# Patient Record
Sex: Female | Born: 1970 | Race: Black or African American | Hispanic: No | Marital: Married | Smoking: Never smoker
Health system: Southern US, Community
[De-identification: ages and names within clinical notes are randomized; demographics above are authoritative.]

## PROBLEM LIST (undated history)

## (undated) DIAGNOSIS — D259 Leiomyoma of uterus, unspecified: Secondary | ICD-10-CM

## (undated) HISTORY — PX: NEUROMA SURGERY: SHX722

## (undated) HISTORY — PX: UTERINE FIBROID SURGERY: SHX826

## (undated) HISTORY — PX: BACK SURGERY: SHX140

---

## 1998-12-23 ENCOUNTER — Other Ambulatory Visit: Admission: RE | Admit: 1998-12-23 | Discharge: 1998-12-23 | Payer: Self-pay | Admitting: Family Medicine

## 2000-12-07 ENCOUNTER — Other Ambulatory Visit: Admission: RE | Admit: 2000-12-07 | Discharge: 2000-12-07 | Payer: Self-pay | Admitting: Family Medicine

## 2001-11-16 ENCOUNTER — Encounter: Admission: RE | Admit: 2001-11-16 | Discharge: 2001-12-21 | Payer: Self-pay | Admitting: Internal Medicine

## 2002-02-16 ENCOUNTER — Encounter: Payer: Self-pay | Admitting: Orthopaedic Surgery

## 2002-02-16 ENCOUNTER — Inpatient Hospital Stay (HOSPITAL_COMMUNITY): Admission: EM | Admit: 2002-02-16 | Discharge: 2002-02-17 | Payer: Self-pay | Admitting: Orthopaedic Surgery

## 2002-12-31 ENCOUNTER — Other Ambulatory Visit: Admission: RE | Admit: 2002-12-31 | Discharge: 2002-12-31 | Payer: Self-pay | Admitting: *Deleted

## 2004-03-24 ENCOUNTER — Other Ambulatory Visit: Admission: RE | Admit: 2004-03-24 | Discharge: 2004-03-24 | Payer: Self-pay | Admitting: Family Medicine

## 2004-03-24 ENCOUNTER — Encounter: Admission: RE | Admit: 2004-03-24 | Discharge: 2004-03-24 | Payer: Self-pay | Admitting: Family Medicine

## 2006-05-11 ENCOUNTER — Other Ambulatory Visit: Admission: RE | Admit: 2006-05-11 | Discharge: 2006-05-11 | Payer: Self-pay | Admitting: Family Medicine

## 2006-05-16 ENCOUNTER — Encounter: Admission: RE | Admit: 2006-05-16 | Discharge: 2006-05-16 | Payer: Self-pay | Admitting: Family Medicine

## 2006-10-06 ENCOUNTER — Encounter (INDEPENDENT_AMBULATORY_CARE_PROVIDER_SITE_OTHER): Payer: Self-pay | Admitting: Specialist

## 2006-10-06 ENCOUNTER — Ambulatory Visit (HOSPITAL_COMMUNITY): Admission: RE | Admit: 2006-10-06 | Discharge: 2006-10-07 | Payer: Self-pay | Admitting: Obstetrics and Gynecology

## 2007-10-25 ENCOUNTER — Other Ambulatory Visit: Admission: RE | Admit: 2007-10-25 | Discharge: 2007-10-25 | Payer: Self-pay | Admitting: Family Medicine

## 2008-10-30 ENCOUNTER — Other Ambulatory Visit: Admission: RE | Admit: 2008-10-30 | Discharge: 2008-10-30 | Payer: Self-pay | Admitting: Family Medicine

## 2009-10-20 ENCOUNTER — Encounter: Admission: RE | Admit: 2009-10-20 | Discharge: 2009-10-20 | Payer: Self-pay | Admitting: Obstetrics and Gynecology

## 2010-11-12 ENCOUNTER — Other Ambulatory Visit: Payer: Self-pay | Admitting: Obstetrics and Gynecology

## 2010-11-12 DIAGNOSIS — Z1231 Encounter for screening mammogram for malignant neoplasm of breast: Secondary | ICD-10-CM

## 2010-12-09 ENCOUNTER — Ambulatory Visit: Payer: Self-pay

## 2010-12-21 ENCOUNTER — Inpatient Hospital Stay: Admission: RE | Admit: 2010-12-21 | Payer: Self-pay | Source: Ambulatory Visit

## 2010-12-31 NOTE — Op Note (Signed)
[name], [name]                 ACCOUNT NO.:  [id number]   MEDICAL RECORD NO.:  [id number]          PATIENT TYPE:  AMB   LOCATION:  DAY                          FACILITY:  [facility]   PHYSICIAN:  Crist Fat. Rivard, M.D. DATE OF BIRTH:  November 15, 1970   DATE OF PROCEDURE:  10/06/2006  DATE OF DISCHARGE:                               OPERATIVE REPORT   PREOPERATIVE DIAGNOSIS:  Uterine fibroids.   POSTOPERATIVE DIAGNOSIS:  Uterine fibroids.   ANESTHESIA:  General, Dr. __________ .   PROCEDURE:  Robotically assisted laparoscopy myomectomy.   SURGEON:  Crist Fat. Rivard, M.D.   ASSISTANTMarquis Lunch. Lowell Guitar, P.A.   DESCRIPTION OF PROCEDURE:  After being informed of the planned procedure  with possible complications including bleeding, infection, injury to  other organs, need for laparotomy, postoperative course, postoperative  dependent edema, hospital stay, full recovery, postoperative tubal  occlusion, and the possibility of growth of new fibroids; informed  consent is obtained.  The patient is taken to OR #10, given general  anesthesia with endotracheal intubation without complication.  She is  placed in the lithotomy position on a sticky mattress with sequential  compression device, knee high.  Her arms are padded and tucked.  Shoulder restraints are applied; and chest is restrained to the table.  She is then prepped and draped in a sterile fashion.  Pelvic exam  reveals an anteverted uterus with mainly posterior fibroids, two adnexa  that are normal.  Uterus is 12-14 weeks in size.  A weighted speculum is  inserted.  Anterior lip of the cervix is grasped with a tenaculum  forceps.  The cervix is easily dilated using Hegar dilator until #14  which allows [place] to sound the uterus at 7 cm.   A small #6 Rumi intrauterine manipulator is inserted easily; and the  balloon is inflated with 4 mL of saline.  The weighted speculum is  removed, but the tenaculum is kept on the anterior lip of the  cervix.  A  Foley catheter is then inserted in her bladder.  The patient is then  placed in steep Trendelenburg.   We measure 5 cm above the umbilicus on the midline and infiltrate this  area with 5 mL of Marcaine 0.25.  We perform a 10-mm vertical incision  and using an S-retractor we identify the fascia which is grasped and  incised.  Peritoneum is entered bluntly.  A #0 Vicryl pursestring suture  is placed on the fascia; and a 10-mm Hasson on trocar is inserted in the  intra abdominal cavity with the previously placed pursestring suture.  We can now create a pneumoperitoneum with C02 at a maximum pressure of  16 mmHg.  The camera is inserted.   OBSERVATION:  Anterior cul-de-sac is normal; posterior cul-de-sac is  normal.  The uterus has a small mid body anterior fibroid of 2 cm, a  right cornual fibroid of 4 cm., a posterior mid body fibroid of 5 cm,  and a posterior left lower uterine segment of fibroid of 5 cm.  Both  tubes and both ovaries are normal.  There are no pelvic adhesions.  The  appendix is seen and normal.  The liver edge is seen as normal;  gallbladder is seen as normal.  We then determine placement of all our  trocars; and all trocar sites are infiltrated with Marcaine 0.25 for a  total of 14 mL.  We placed two 8-mm robotic trocars on the left side,  one 8-mm robotic trocar on the right side; and one patient side  assistant 10-mm trocar on the right side, all under direct  visualization.  Placing a tenaculum grasper in arm #3, a PK forceps in  arm #2 and a hot scissors in arm #1 after we have docked the robot  easily.   Console time starts.  The uterus is moved anteriorly; and we  investigated the left lower uterine posterior fibroid, which  unfortunately is in continuity with the uterine artery.  It appears to  be subserosal; and the serosa is grasped and incised and cauterized with  PK forceps.  This allows [place] to enucleate most of the fibroid, but at its   junction with the uterine artery, decision is made not to pursue for  risk of damage to the uterine artery as well as impossibility to fully  visualize the ureter away from that side.  We then proceed with excision  of the fibroid, leaving 10% of its volume still attached to the lower  uterine body.  The fibroid is deposited in the cul-de-sac; and  hemostasis is achieved with cauterization.  We then proceed with the  myomectomy of the posterior fibroid; and that area is infiltrated with  vasopressin 10 mL.  The serosa is opened with unipolar scissors until  the fibroid is identified.  The fibroid is grasped with tenaculum  forceps; and with traction, countertraction, blunt-and-sharp dissection  we are able to be enucleate it completely and deposit it in the  posterior cul-de-sac.  The myometrium of this incision is then closed in  multiple layers of figure-of-eight stitches of #0 Vicryl; and then with  a baseball suture of #0 Vicryl.  Hemostasis is adequate.   Our attention is then directed to the right cornual fibroid.  Its serosa  is infiltrated with vasopressin 10 mL; and is opened with monopolar hot  scissors.  This allows [place] to visualize the fibroid grasp it with the  tenaculum forceps and using traction, countertraction, blunt-and-sharp  dissection we are able to be enucleate it completely with no entry in  the endometrial cavity.  The fibroid is deposited in the posterior cul-  de-sac; and myometrium is closed in two layers of figure-of-eight  stitches of #0 Vicryl including the serosa.  Hemostasis is adequate.  Two subserosal less than 1 cm fibroid on the left cornu are easily  enucleated, cauterized, and removed via the patient's side port.  Finally the 2 cm midbody anterior fibroid is enucleated after opening  the serosa with hot scissors; and removed through the 10-mm right-sided trocar.  This myometrium is then closed with two figure-of-eight  stitches of #0 Vicryl.   Hemostasis is adequate.  We proceed with profuse  irrigation of the pelvis with warm saline.  Complete hemostasis with  bipolar cauterization on the left lower uterine posterior fibroid  resection.  Hemostasis is then deemed adequate.  The 10-mm right trocar  is removed to allow the morcellator insertion.  All three large fibroids  from the posterior cul-de-sac are completely morcellated and removed  from the body.  We, again, irrigate profusely with  warm saline, note a  satisfactory hemostasis; and Tisseel is then deposited on the three  suture lines of the uterus.  We are satisfied with hemostasis.   The robot is then undocked; and trocars are removed under direct  visualization after evacuation of the pneumoperitoneum.  The fascia of  the supraumbilical incision is closed with the previously placed  pursestring suture of #0 Vicryl after ensuring no bowel loop is  protruding.  The fascia of the right patient side assistant trocar is  visualized, grasped with a Kocher forceps, and closed with a figure-of-  eight stitch of #0 Vicryl.  All incision skin is closed with  subcuticular suture of 3-0 Monocryl or Vicryl and Steri-Strips.   The intrauterine Rumi manipulator is then removed as well as the  tenaculum.  Cervix is evaluated and hemostasis is adequate.  Instruments  and sponge count is complete x2.  Estimated blood loss is 150 mL.  The  procedures is well tolerated by the patient who is taken to the recovery  room in a well and stable condition.      Crist Fat Rivard, M.D.  Electronically Signed     SAR/MEDQ  D:  10/06/2006  T:  10/06/2006  Job:  [id number]

## 2010-12-31 NOTE — H&P (Signed)
[name], [name]                 ACCOUNT NO.:  [id number]   MEDICAL RECORD NO.:  [id number]         PATIENT TYPE:  LAMB   LOCATION:                                FACILITY:  [facility]   PHYSICIAN:  Crist Fat. Rivard, M.D. DATE OF BIRTH:  11-Mar-1971   DATE OF ADMISSION:  DATE OF DISCHARGE:                              HISTORY & PHYSICAL   REASON FOR ADMISSION:  Uterine fibroids, inpatient desiring myomectomy.   HISTORY OF PRESENT ILLNESS:  This is a 40 year old single African  American female gravida 0 who presents to me on June 21, 2006, upon  recommendation of Dr. Osborn Bowen to discuss the possibility of  myomectomy.  This young lady initially presented to our practice with  complaints of urinary frequency and urgency, but no complaints of  urinary incontinence.  She is concerned that her symptoms have worsened  since the size of her fibroids have increased.  She wanted to review the  benefits of myomectomy.  Currently, for her urinary symptoms, she is  using Detrol LA with no improvement, and she has also previously tried  Information systems manager with no improvement.   An ultrasound down at [facility] on May 16, 2006, revealed a  posterior fibroid measuring 3.4x3.5x4.1 cm.  Another posterior fibroid,  mid body, measuring 2.9x3.2x3.4 cm and a fundal fibroid measuring  2.2x2.3x2.2 cm.   Her menstrual cycles are otherwise monthly with normal flow and no  severe dysmenorrhea, although, she is currently using Ortho-Evra patch  for contraceptive benefits.   Her urinary symptoms include urgency, having to use the bathroom every  one and a half hours with urgency incontinence.  She also reports  occasional incomplete emptying and occasional nocturia.  She denies any  dysuria, hematuria or urinary stress incontinence.  Her bowel habits  show chronic constipation with no change since her fibroids have been  diagnosed, but the patient has been known for irritable bowel syndrome.   She  is seeking robotic myomectomy today.  Although, she has been  informed on three occasions that this procedure may not completely  resolve her urinary symptoms.  She is also aware of the risks of the  procedure.   REVIEW OF SYSTEMS:  CONSTITUTIONAL:  Negative.  HEENT:  Negative.  CARDIOVASCULAR:  Negative.  GENITOURINARY:  See HPI.  RESPIRATORY:  Negative.  GASTROINTESTINAL:  Chronic constipation.  NEUROLOGICAL:  Negative.   PAST MEDICAL HISTORY:  1. Back surgery in July 2003.  2. Irritable bowel syndrome with constipation.  3. Gravida 0.   CURRENT MEDICATIONS:  Ortho-Evra and Detrol LA.   ALLERGIES:  No known drug allergies.   SOCIAL HISTORY:  Single, nonsmoker, lives alone, works in Chief Financial Officer for  [employer].   FAMILY HISTORY:  Negative for feminine or colon cancer.   PHYSICAL EXAMINATION:  VITAL SIGNS:  Blood pressure 118/78.  HEENT:  Negative.  Thyroid normal.  HEART:  Normal.  CHEST:  Clear.  BACK:  No CVA tenderness.  ABDOMEN:  No tenderness, masses or hepatosplenomegaly.  EXTREMITIES:  Negative.  NEUROLOGICAL:  Within normal limits.  GYN:  Normal external  genitalia and vagina.  Cervix is normal.  Uterus  is increased in size, 10-12 weeks irregular, predominantly with  posterior fibroids.  Nontender and mobile.  No adnexal masses felt.   ASSESSMENT:  Uterine fibroids in patient seeking robotic myomectomy,  symptomatic with urinary frequency.   PLAN:  We have scheduled a robotic myomectomy on February 22 at 7:30 at  [facility].  The procedure has been thoroughly reviewed with  the patient as well as the possible risks and complications including  but not limited to bleeding, infection, injury to other organs, need for  laparotomy, risk of new fibroids arising in the future, risk of postop  tubal occlusion.  Hospital course and recovery have been discussed as  well.  The length of the procedure being longer than traditional  laparotomy has been  discussed, and the postoperative dependent edema has  also been reviewed.   Finally, the patient has been informed that removal of fibroids may not  alleviate all of her urinary symptoms, and she is agreeable to proceed.  She has been given MiraLax bowel preparation as well as preoperative  instructions.      Crist Fat Rivard, M.D.  Electronically Signed     SAR/MEDQ  D:  09/27/2006  T:  09/27/2006  Job:  [id number]

## 2010-12-31 NOTE — Op Note (Signed)
[facility]  Patient:    [name], [name] Visit Number: [id number] MRN: [id number]          Service Type: MED Location: [phone] Attending Physician:  Patricia Nettle Dictated by:   Patricia Nettle, M.D. Proc. Date: 02/16/02 Admit Date:  02/16/2002 Discharge Date: 02/17/2002                             Operative Report  PREOPERATIVE DIAGNOSES: 1. 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 2. Degenerative disk disease.  POSTOPERATIVE DIAGNOSES: 1. 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 2. Degenerative disk disease.  PROCEDURE:  Right L5-S1 microdiskectomy with lateral recessed decompression.  SURGEON:  Patricia Nettle, M.D.  ASSISTANT:  Colleen P. Mahar, P.A.  ANESTHESIA:  General endotracheal.  COMPLICATIONS:  None.  INDICATIONS FOR PROCEDURE:  The patient is a 40 year old left hand dominant female who I have been treating for a right S1 radiculopathy. She has had an epidural steroid injection without any relief. Because she has been taking increasing amounts of narcotics, muscle relaxers and unable to ambulate, she was admitted to the hospital. Her examination was consistent with an S1 radiculopathy. Her MRI scan shows a large herniation at L5-S1 with severe compression of the right S1 nerve root. In addition, she has degenerative changes at L3-4 and L4-5 with an annular rent at the L4-5 level and a broad based disk bulge with lateral recess decompression. All of the risks, benefits, and alternatives of surgery were discussed with the patient and she elected to proceed. She understands that she does have significant degenerative changes at the L3-4 and L4-5 level and may need additional surgery there as well as a possible fusion procedure in the future at the L5-S1 level.  DESCRIPTION OF PROCEDURE:  The patient was properly identified in the holding area and taken to the operating room. She underwent general  endotracheal anesthesia without difficulty. She was given prophylactic IV antibiotics. She was turned prone onto the Acromed four-poster positioning frame. All bony prominences were padded. Her face and eyes were protected. The back was prepped and draped in the usual sterile fashion. We used fluoroscopy to identify the L5-S1 level at the skin using a spinal needle. A 2 1/2 cm incision was made directly over the L5-S1 disk space. This was carried down the deep fascia. The patient had quite a bit of adipose tissue at this level. Using long retractors, we identified the fascia, incised it and elevated the paraspinal muscles on the right side to the L5-S1 facet joint. Care was taken to protect the facet joint capsule. We then placed a curette under the L5 lamina again brought the fluoroscopy and confirmed that we were at the correct location. At this point, the operating microscope was draped and brought onto the surgical field. Using a high speed bur, the inferior 1/3 of the L5 lamina was removed as well as the medial 1/3 of the facet joint. Using micro Kerrisons and curettes, the over hanging lip of the S1 superior facet was removed flush with the S1 pedicle. We immediately identified a large disk herniation that had partially extruded through the annulus. The S1 nerve root was compressed and displaced medially. We easily popped into the disk space with a Penfield and then removed disk material with a pituitary rongeur. At this point, the S1 nerve root began to move laterally into its more normal position. It became evident that  the nerve root was inflamed and reddened. By carefully retracting the root medially, we were able to complete the diskectomy. All loose fragments were removed. We irrigated the disk space using an angiocath and floated out any loose fragments, these were all removed. We confirmed that the nerve root was free from the takeoff on the common dural sac all the way out  the S1 foramen by passing a blunt probe. When we were satisfied with the decompression, 50 mcg of fentanyl were left in the epidural space. A fat graft was taken from the subcutaneous tissue and placed over the nerve root. We then closed the wound in layers, first with #1 Vicryl in the deep fascia followed by 2-0 Vicryl in the subcutaneous layer and then a running 4-0. Benzoin and Steri-Strips were placed. The patient was turned supine, extubated without difficulty and transferred to the recovery room in stable condition. Dictated by:   Patricia Nettle, M.D. Attending Physician:  Patricia Nettle. DD:  02/16/02 TD:  02/19/02 Job: [id number]

## 2010-12-31 NOTE — H&P (Signed)
[facility]  Patient:    [name], [name] Visit Number: [id number] MRN: [id number]          Service Type: MED Location: [phone] Attending Physician:  Patricia Nettle Dictated by:   Patricia Nettle, M.D. Admit Date:  02/16/2002 Discharge Date: 02/17/2002                           History and Physical  CHIEF COMPLAINT:  Back and right leg pain.  HISTORY OF PRESENT ILLNESS:  The patient is a 40 year old left-hand-dominant female who I first saw in my office on January 23, 2002.  At that time she had severe back and right lower extremity pain.  Her symptoms were consistent with a right S1 radiculopathy.  I sent her for an MRI scan which showed a large right posterolateral disk herniation at L5-S1 with compression of the S1 root and possibly the L5 root at this level.  At L4-5 there was a posterior annular tear with a broad-based disk protrusion and some lateral recess stenosis on the right.  At L3-4 there was disk desiccation with a left extra foraminal disk herniation that was in the vicinity of the left L3 root.  I discussed all the treatment options with the patient on her return visit and she elected to undergo a selective nerve root injection.  This has this done and unfortunately had very little relief of her symptoms.  Over the past week or so her symptoms have been progressively worsening to the point that she is not ambulatory.  She is unable to sleep.  She is taking progressively more and more narcotic medication, anti-inflammatories and muscle relaxers.  She has been using a TENS unit.  Because of some progressive weakness in the right lower extremity she was admitted to the hospital.  She has a history of an overactive bladder and takes Detrol for this.  She has also been constipated. No incontinence of bowel or bladder.  No fevers, chills, sweats or unexplained weight loss.  No dysuria.  PAST MEDICAL HISTORY:  None.  PAST SURGICAL HISTORY:   None.  MEDICATIONS: 1. Detrol hormone patch. 2. Zelnorm. 3. She has also been taking Vicodin. 4. Flexeril. 5. Diclofenac.  ALLERGIES:  None.  SOCIAL HISTORY:  The patient is single and works at [employer] full time.  Does not use tobacco or alcohol.  Her primary care physician is Dr. Parke Simmers.  PHYSICAL EXAMINATION:  VITAL SIGNS:  She is 5 feet 2-1/2 inches tall.  She weighs 160 pounds.  MUSCULOSKELETAL:  She has an antalgic gait to the right.  She is really unable to stand erect.  She has severe pain with both extension and forward flexion radiating into the right lower extremity. There is tenderness over the right sciatic notch.  Motor examination shows 4/5 strength in the right gastrosoleus.  Sensation is decreased in the S1 distribution.  Straight leg raising is positive on the right.  Her buttock pain with radiation into the posterior thigh and calf.  Reflexes are hypoactive in the lower extremities. Babinski, Clonus, Hoffman negative.  Trochanter nontender.  LABORATORY DATA:  Show white blood count 7.7, hemoglobin 13.8, platelets 201. PT 12.5, PTT 26.  Her BMET was normal.  Beta hCG less than 2.  Her UA is pending.  IMPRESSION:  Large right L5-S1 disc herniation with intractable pain and progressive weakness.  PLAN:  I spoke to the patient yesterday as well as this  morning on the phone. Because of her progressive neurologic deficits and increasing pain uncontrolled with narcotics I have admitted her to the hospital.  I had a discussion with her concerning all the treatment options which would include IV pain medication, IV steroids, additional epidural steroid injections, neuroleptic medications, and additional time.  I do think that surgery would be indicated.  I discussed this with the patient and her family in detail which would require a L5-S1 microdiskectomy with decompression of the S1 nerve root.  She understands that she does have a herniation at L4-5. This  may become symptomatic in the future.  In addition she very well may have a recurrent herniation at L5-S1 requiring effusion in the future.  We discussed all of the risks of surgery including bleeding, infection, nerve root injury, dural tear, CSF leak, recurrent herniation and need for additional surgery including spinal fusion.  We also discussed the medical risks of surgery including MI, stroke, PE, and other imponderables.  I gave the patient a booklet on lumbar microsurgery and reviewed it with her.  We are going to make arrangements to do surgery today.  I think she has a good understanding of the above issues and we are going to proceed as she feels appropriate. Dictated by:   Patricia Nettle, M.D. Attending Physician:  Patricia Nettle. DD:  02/16/02 TD:  02/19/02 Job: [id number]

## 2011-03-29 ENCOUNTER — Ambulatory Visit
Admission: RE | Admit: 2011-03-29 | Discharge: 2011-03-29 | Disposition: A | Discharge status: Home / Self Care | Payer: Self-pay | Source: Ambulatory Visit | Attending: Obstetrics and Gynecology | Admitting: Obstetrics and Gynecology

## 2011-10-25 ENCOUNTER — Ambulatory Visit: Payer: Self-pay | Admitting: Obstetrics and Gynecology

## 2011-11-08 ENCOUNTER — Ambulatory Visit (INDEPENDENT_AMBULATORY_CARE_PROVIDER_SITE_OTHER): Payer: 59 | Admitting: Obstetrics and Gynecology

## 2011-11-08 DIAGNOSIS — Z01419 Encounter for gynecological examination (general) (routine) without abnormal findings: Secondary | ICD-10-CM

## 2011-11-28 ENCOUNTER — Telehealth: Payer: Self-pay | Admitting: Obstetrics and Gynecology

## 2011-11-28 NOTE — Telephone Encounter (Signed)
Pt can't find Nuvaring to remove.  Told pt it is possible that it has come out on it's own.  Pt is due to put in another ring on 12-04-11. Pt has no idea when the ring fell out.  Will consult SR for instructions.  ld

## 2011-11-28 NOTE — Telephone Encounter (Signed)
Routed to laura

## 2011-11-29 NOTE — Telephone Encounter (Signed)
Give appointment before due to reinsert. Advise condoms.

## 2012-03-05 DIAGNOSIS — I781 Nevus, non-neoplastic: Secondary | ICD-10-CM | POA: Insufficient documentation

## 2012-03-07 ENCOUNTER — Other Ambulatory Visit: Payer: Self-pay | Admitting: Obstetrics and Gynecology

## 2012-03-07 DIAGNOSIS — Z1231 Encounter for screening mammogram for malignant neoplasm of breast: Secondary | ICD-10-CM

## 2012-04-03 ENCOUNTER — Ambulatory Visit: Payer: 59

## 2012-04-04 ENCOUNTER — Ambulatory Visit (HOSPITAL_BASED_OUTPATIENT_CLINIC_OR_DEPARTMENT_OTHER)
Admission: RE | Admit: 2012-04-04 | Discharge: 2012-04-04 | Disposition: A | Discharge status: Home / Self Care | Payer: 59 | Source: Ambulatory Visit | Attending: Obstetrics and Gynecology | Admitting: Obstetrics and Gynecology

## 2012-04-04 DIAGNOSIS — Z1231 Encounter for screening mammogram for malignant neoplasm of breast: Secondary | ICD-10-CM

## 2012-04-06 NOTE — Progress Notes (Signed)
Quick Note:  Please send "Dense breast" letter to patient and document in chart when letter is sent. ______ 

## 2012-04-09 ENCOUNTER — Encounter: Payer: Self-pay | Admitting: Obstetrics and Gynecology

## 2013-03-21 ENCOUNTER — Other Ambulatory Visit: Payer: Self-pay

## 2013-03-21 DIAGNOSIS — Z1231 Encounter for screening mammogram for malignant neoplasm of breast: Secondary | ICD-10-CM

## 2013-04-05 ENCOUNTER — Ambulatory Visit: Payer: 59

## 2013-04-19 ENCOUNTER — Ambulatory Visit
Admission: RE | Admit: 2013-04-19 | Discharge: 2013-04-19 | Disposition: A | Discharge status: Home / Self Care | Payer: BC Managed Care – PPO | Source: Ambulatory Visit

## 2013-04-19 DIAGNOSIS — Z1231 Encounter for screening mammogram for malignant neoplasm of breast: Secondary | ICD-10-CM

## 2014-03-20 ENCOUNTER — Other Ambulatory Visit (HOSPITAL_BASED_OUTPATIENT_CLINIC_OR_DEPARTMENT_OTHER): Payer: Self-pay | Admitting: Obstetrics and Gynecology

## 2014-03-20 DIAGNOSIS — Z1231 Encounter for screening mammogram for malignant neoplasm of breast: Secondary | ICD-10-CM

## 2014-04-24 ENCOUNTER — Ambulatory Visit: Payer: BC Managed Care – PPO

## 2014-04-30 ENCOUNTER — Ambulatory Visit (HOSPITAL_BASED_OUTPATIENT_CLINIC_OR_DEPARTMENT_OTHER)
Admission: RE | Admit: 2014-04-30 | Discharge: 2014-04-30 | Disposition: A | Discharge status: Home / Self Care | Payer: BC Managed Care – PPO | Source: Ambulatory Visit | Attending: Obstetrics and Gynecology | Admitting: Obstetrics and Gynecology

## 2014-04-30 DIAGNOSIS — Z1231 Encounter for screening mammogram for malignant neoplasm of breast: Secondary | ICD-10-CM

## 2014-08-15 HISTORY — PX: ANKLE FRACTURE SURGERY: SHX122

## 2014-08-15 HISTORY — PX: FRACTURE SURGERY: SHX138

## 2014-09-08 DIAGNOSIS — M25572 Pain in left ankle and joints of left foot: Secondary | ICD-10-CM | POA: Insufficient documentation

## 2014-09-08 DIAGNOSIS — T148XXA Other injury of unspecified body region, initial encounter: Secondary | ICD-10-CM | POA: Insufficient documentation

## 2014-09-29 DIAGNOSIS — Z9889 Other specified postprocedural states: Secondary | ICD-10-CM | POA: Insufficient documentation

## 2014-09-29 DIAGNOSIS — E663 Overweight: Secondary | ICD-10-CM | POA: Insufficient documentation

## 2015-01-25 DIAGNOSIS — Z969 Presence of functional implant, unspecified: Secondary | ICD-10-CM | POA: Insufficient documentation

## 2015-05-23 IMAGING — MG MM DIGITAL SCREENING BILAT W/ CAD
5 series · 5 of 5 positions shown · non-contrast
Comparison: Previous exam(s).

CLINICAL DATA: Screening.

EXAM:
DIGITAL SCREENING BILATERAL MAMMOGRAM WITH CAD

[R CC (1 of 2)]
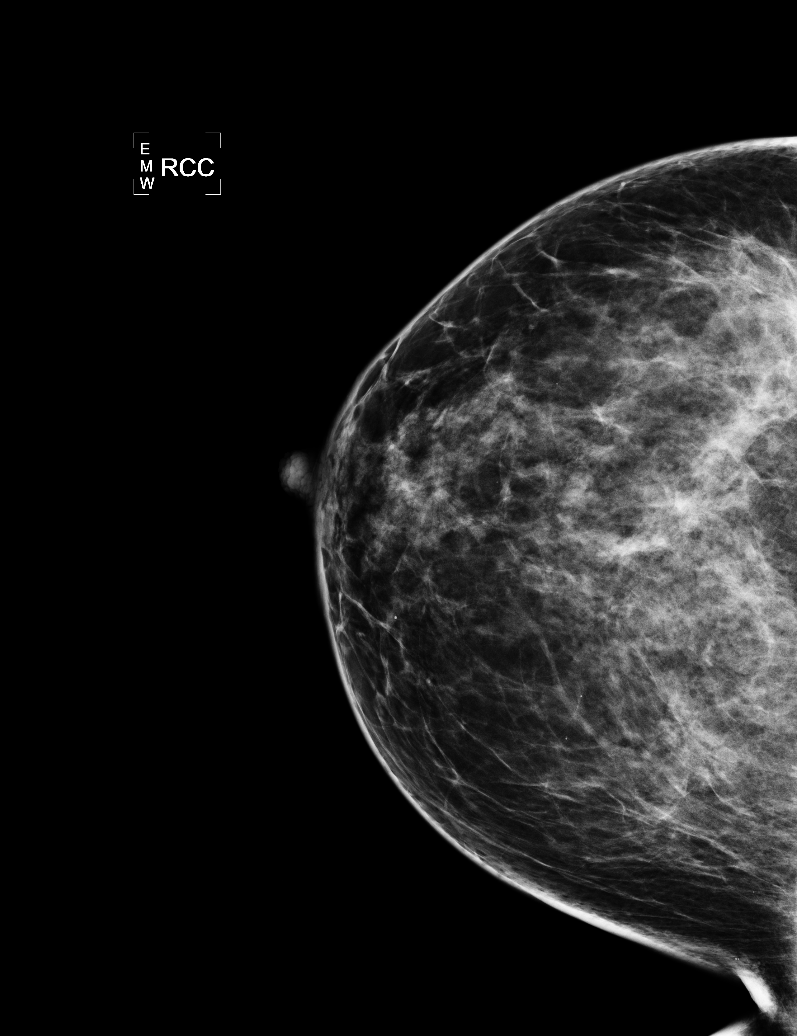

[L CC]
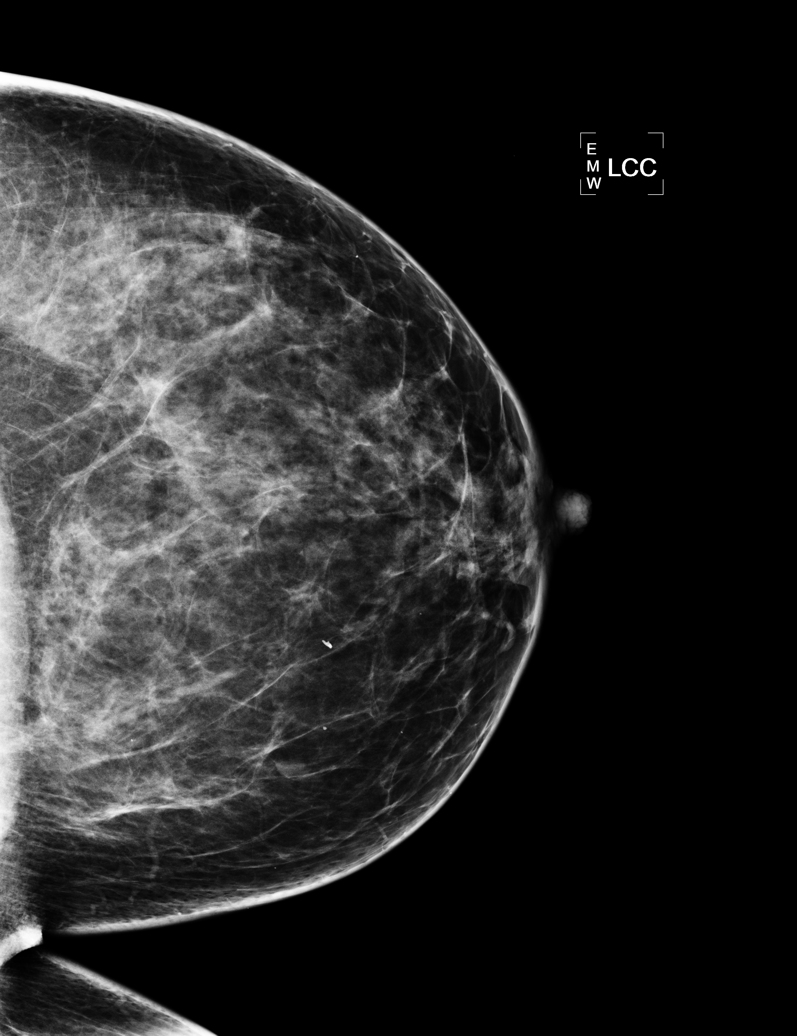

[L MLO]
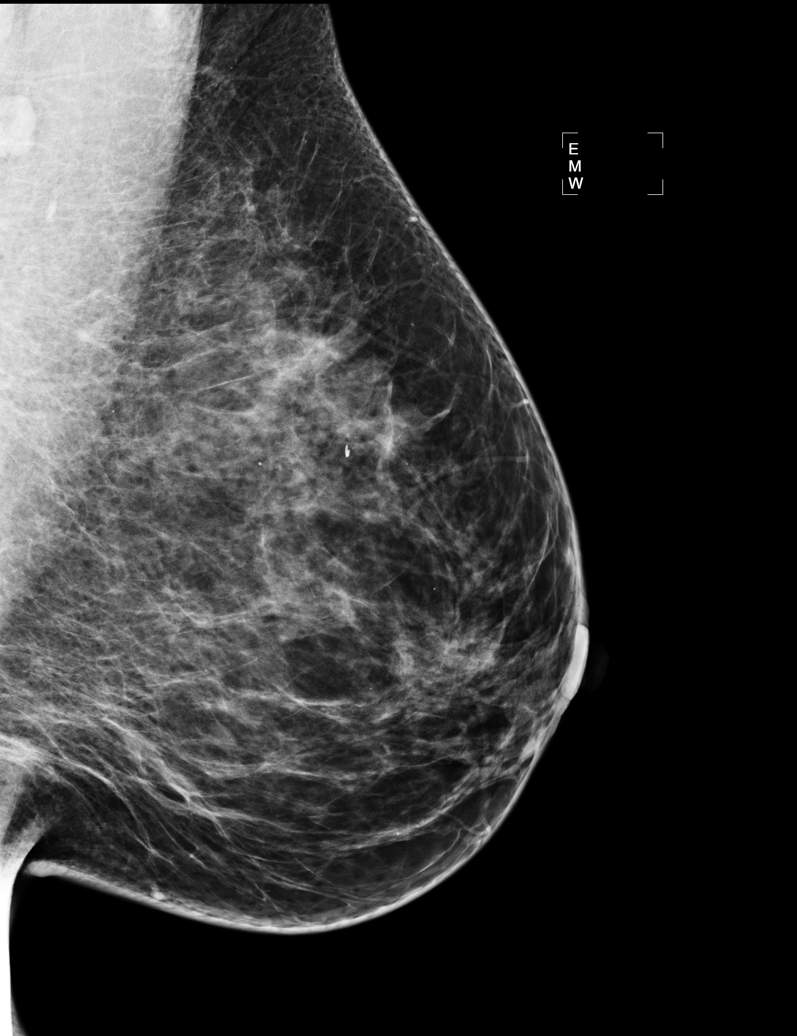

[R MLO]
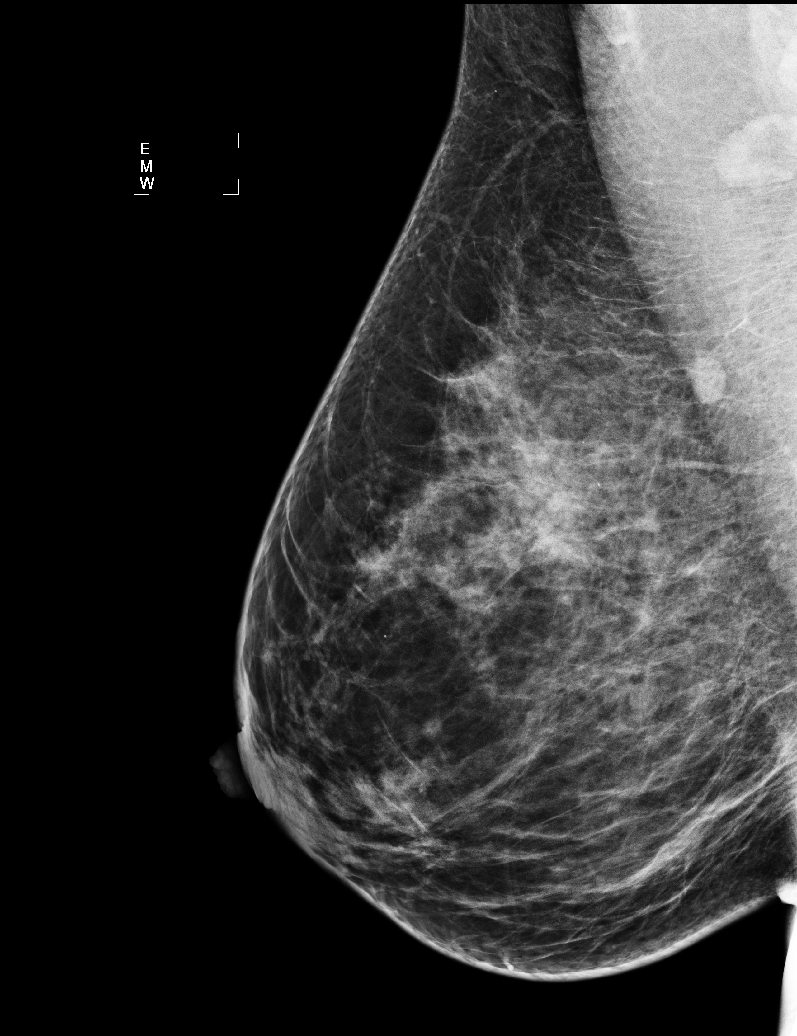

[R CC (2 of 2)]
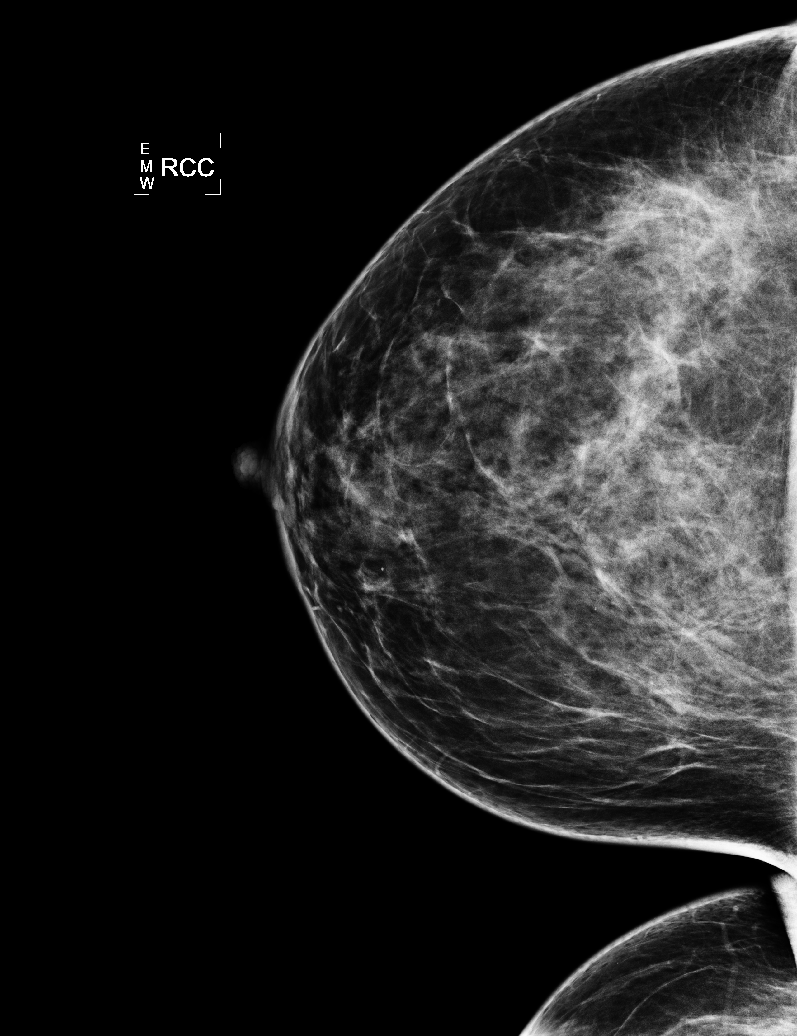

[5 of 5 positions shown; findings below may reference images not displayed]

ACR Breast Density Category c: The breast tissue is heterogeneously
dense, which may obscure small masses.
FINDINGS: There are no findings suspicious for malignancy. Images were
processed with CAD.
IMPRESSION: No mammographic evidence of malignancy. A result letter of this
screening mammogram will be mailed directly to the patient.

RECOMMENDATION:
Screening mammogram in one year. (Code:YJ-[id number])

BI-RADS CATEGORY  1: Negative.

## 2019-05-20 DIAGNOSIS — R011 Cardiac murmur, unspecified: Secondary | ICD-10-CM | POA: Insufficient documentation

## 2019-08-21 DIAGNOSIS — M79672 Pain in left foot: Secondary | ICD-10-CM | POA: Insufficient documentation

## 2019-08-21 DIAGNOSIS — Z789 Other specified health status: Secondary | ICD-10-CM | POA: Insufficient documentation

## 2021-01-13 ENCOUNTER — Ambulatory Visit: Payer: BC Managed Care – PPO | Admitting: Podiatry

## 2021-01-25 ENCOUNTER — Ambulatory Visit (INDEPENDENT_AMBULATORY_CARE_PROVIDER_SITE_OTHER): Payer: BC Managed Care – PPO

## 2021-01-25 ENCOUNTER — Ambulatory Visit (INDEPENDENT_AMBULATORY_CARE_PROVIDER_SITE_OTHER): Payer: BC Managed Care – PPO | Admitting: Podiatry

## 2021-01-25 ENCOUNTER — Encounter: Payer: Self-pay | Admitting: Podiatry

## 2021-01-25 ENCOUNTER — Other Ambulatory Visit: Payer: Self-pay

## 2021-01-25 DIAGNOSIS — M7752 Other enthesopathy of left foot: Secondary | ICD-10-CM | POA: Diagnosis not present

## 2021-01-25 DIAGNOSIS — M7751 Other enthesopathy of right foot: Secondary | ICD-10-CM

## 2021-01-25 MED ORDER — MELOXICAM 15 MG PO TABS: 15.0000 mg | ORAL_TABLET | Freq: Every day | ORAL | 1 refills | Status: DC

## 2021-01-25 NOTE — Progress Notes (Signed)
   HPI: 50 y.o. female presenting today as a new patient referral from Dr. Gershon Mussel, local podiatrist for evaluation of bilateral great toe pain this been going on for few months now.  Patient admits to wearing heels and tight fitting shoes that aggravate the great toe joints.  She has received injections in the past with modest improvement.  Today she says that the great toe joints are feeling much better.  She completed a Medrol Dosepak a few weeks ago.  No past medical history on file.   Physical Exam: General: The patient is alert and oriented x3 in no acute distress.  Dermatology: Skin is warm, dry and supple bilateral lower extremities. Negative for open lesions or macerations.  Vascular: Palpable pedal pulses bilaterally. No edema or erythema noted. Capillary refill within normal limits.  Neurological: Epicritic and protective threshold grossly intact bilaterally.   Musculoskeletal Exam: Range of motion within normal limits to all pedal and ankle joints bilateral. Muscle strength 5/5 in all groups bilateral.  Slight tenderness palpation to the first MTPJ bilateral.  Very mild clinical evidence of hallux valgus deformity  Radiographic Exam:  Normal osseous mineralization. Joint spaces preserved. No fracture/dislocation/boney destruction.  Mildly increased intermetatarsal angle between the first and second metatarsals noted on AP view bilateral  Assessment: 1.  First MTPJ capsulitis bilateral 2.  Minimal bunion deformities bilateral   Plan of Care:  1. Patient evaluated. X-Rays reviewed.  2.  At the moment I do not believe that the patient would benefit from surgical intervention.  She does not have a severe hallux valgus deformity 3.  Recommend that the patient wears wide fitting shoes that do not irritate the great toe joints.  The patient is very adamant about wearing heels and dress shoes, however over the next 3-4 weeks she would benefit not to wear tight fitting dress shoes with  heels 4.  Prescription for meloxicam 15 mg daily  5.  Return to clinic as needed      Edrick Kins, DPM [facility]  Dr. Edrick Kins, DPM    [address], [place] [address]                Office [phone]  Fax [phone]

## 2021-03-25 ENCOUNTER — Other Ambulatory Visit: Payer: Self-pay | Admitting: Podiatry

## 2021-04-29 ENCOUNTER — Other Ambulatory Visit: Payer: Self-pay

## 2021-04-29 ENCOUNTER — Encounter: Payer: Self-pay | Admitting: Emergency Medicine

## 2021-04-29 ENCOUNTER — Emergency Department
Admission: EM | Admit: 2021-04-29 | Discharge: 2021-04-29 | Disposition: A | Discharge status: Home / Self Care | Payer: Managed Care, Other (non HMO) | Source: Home / Self Care | Attending: Family Medicine | Admitting: Family Medicine

## 2021-04-29 DIAGNOSIS — R6889 Other general symptoms and signs: Secondary | ICD-10-CM

## 2021-04-29 HISTORY — DX: Leiomyoma of uterus, unspecified: D25.9

## 2021-04-29 NOTE — ED Triage Notes (Signed)
Fatigue & body aches since Tuesday  Scratchy sore throat  Dayquil & nyquil OTC  COVID vaccine  Negative home COVID test yesterday  COVID vaccine + 2 boosters (12/05/20)

## 2021-04-29 NOTE — Discharge Instructions (Signed)
Make sure you drink plenty of water May use OTC cold and flu medicine Take tylenol or advil/aleve for pain or fever Test results will be available in My Chart

## 2021-04-29 NOTE — ED Provider Notes (Signed)
[name]    CSN: [id number] Arrival date & time: 04/29/21  1515      History   Chief Complaint Chief Complaint  Patient presents with   Fatigue   Generalized Body Aches    HPI [name] is a 50 y.o. female.   HPI  Patient has a history of IBS C.  This is not terribly bothersome for her.  She states that she is here because of illness.  On Tuesday evening she felt very exhausted and on Wednesday spent most the day in bed.  She has mild runny nose.  Mild scratchy throat.  Headache and body aches.  She has felt chilled but has not taken her temperature.  She does have concerns about COVID.  She has no specific exposure.  She does work in an office setting.  She states she also had a family and friends dinner on Sunday at a restaurant. Past Medical History:  Diagnosis Date   Uterine fibroid     There are no problems to display for this patient.   History reviewed. No pertinent surgical history.  OB History   No obstetric history on file.      Home Medications    Prior to Admission medications   Medication Sig Start Date End Date Taking? Authorizing Provider  fexofenadine-pseudoephedrine (ALLEGRA-D 24) 180-240 MG 24 hr tablet Take 1 tablet by mouth daily.   Yes [provider]  linaclotide (LINZESS) 145 MCG CAPS capsule TAKE 1 TABLET BEFORE FIRST MEAL OF THE DAY 90 04/14/17  Yes [provider]  Multiple Vitamins-Minerals (MULTIVITAMIN WITH MINERALS) tablet Take 1 tablet by mouth daily.    [provider]    Family History Family History  Problem Relation Age of Onset   Thyroid disease Mother    Macular degeneration Mother    Glaucoma Mother    Gout Father    GER disease Father    Deep vein thrombosis Father     Social History Social History   Tobacco Use   Smoking status: Never    Passive exposure: Never   Smokeless tobacco: Never  Vaping Use   Vaping Use: Never used  Substance Use Topics   Alcohol use:  Yes    Alcohol/week: 2.0 standard drinks    Types: 2 Standard drinks or equivalent per week     Allergies   Bee venom, Hydrocodone, Bismuth subsalicylate, and Latex   Review of Systems Review of Systems See HPI  Physical Exam Triage Vital Signs ED Triage Vitals  Enc Vitals Group     BP 04/29/21 1539 126/85     Pulse Rate 04/29/21 1539 61     Resp 04/29/21 1539 16     Temp 04/29/21 1539 99.3 F (37.4 C)     Temp Source 04/29/21 1539 Oral     SpO2 04/29/21 1539 99 %     Weight 04/29/21 1543 140 lb (63.5 kg)     Height 04/29/21 1543 '5\' 2"'$  (1.575 m)     Head Circumference --      Peak Flow --      Pain Score 04/29/21 1542 4     Pain Loc --      Pain Edu? --      Excl. in [place]? --    No data found.  Updated Vital Signs BP 126/85 (BP Location: Right Arm)   Pulse 61   Temp 99.3 F (37.4 C) (Oral)   Resp 16   Ht '5\' 2"'$  (  1.575 m)   Wt 63.5 kg   LMP  (LMP Unknown)   SpO2 99%   BMI 25.61 kg/m      Physical Exam Constitutional:      General: She is not in acute distress.    Appearance: She is well-developed and normal weight.  HENT:     Head: Normocephalic and atraumatic.     Right Ear: Tympanic membrane, ear canal and external ear normal.     Left Ear: Tympanic membrane, ear canal and external ear normal.     Nose: No congestion or rhinorrhea.     Mouth/Throat:     Mouth: Mucous membranes are moist.     Pharynx: No posterior oropharyngeal erythema.  Eyes:     Conjunctiva/sclera: Conjunctivae normal.     Pupils: Pupils are equal, round, and reactive to light.  Cardiovascular:     Rate and Rhythm: Normal rate and regular rhythm.     Heart sounds: Normal heart sounds.  Pulmonary:     Effort: Pulmonary effort is normal. No respiratory distress.     Breath sounds: Normal breath sounds. No wheezing or rales.  Abdominal:     General: There is no distension.     Palpations: Abdomen is soft.  Musculoskeletal:        General: Normal range of motion.     Cervical  back: Normal range of motion and neck supple.  Lymphadenopathy:     Cervical: No cervical adenopathy.  Skin:    General: Skin is warm and dry.  Neurological:     Mental Status: She is alert.  Psychiatric:        Mood and Affect: Mood normal.        Behavior: Behavior normal.     UC Treatments / Results  Labs (all labs ordered are listed, but only abnormal results are displayed) Labs Reviewed  COVID-19, FLU A+B AND RSV    EKG   Radiology No results found.  Procedures Procedures (including critical care time)  Medications Ordered in UC Medications - No data to display  Initial Impression / Assessment and Plan / UC Course  I have reviewed the triage vital signs and the nursing notes.  Pertinent labs & imaging results that were available during my care of the patient were reviewed by me and considered in my medical decision making (see chart for details).     We will test for viral infections.  Patient is advised to self manage her symptoms at home.  Return as needed. Final Clinical Impressions(s) / UC Diagnoses   Final diagnoses:  Flu-like symptoms     Discharge Instructions      Make sure you drink plenty of water May use OTC cold and flu medicine Take tylenol or advil/aleve for pain or fever Test results will be available in My Chart    ED Prescriptions   None    PDMP not reviewed this encounter.   Raylene Everts, MD 04/29/21 [phone]

## 2021-05-01 LAB — COVID-19, FLU A+B AND RSV
Influenza A, NAA: NOT DETECTED
Influenza B, NAA: NOT DETECTED
RSV, NAA: NOT DETECTED
SARS-CoV-2, NAA: NOT DETECTED

## 2021-09-05 ENCOUNTER — Other Ambulatory Visit: Payer: Self-pay

## 2021-09-05 ENCOUNTER — Encounter: Payer: Self-pay | Admitting: Emergency Medicine

## 2021-09-05 ENCOUNTER — Emergency Department (INDEPENDENT_AMBULATORY_CARE_PROVIDER_SITE_OTHER)
Admission: EM | Admit: 2021-09-05 | Discharge: 2021-09-05 | Disposition: A | Discharge status: Home / Self Care | Payer: Managed Care, Other (non HMO) | Source: Home / Self Care

## 2021-09-05 DIAGNOSIS — J0120 Acute ethmoidal sinusitis, unspecified: Secondary | ICD-10-CM | POA: Diagnosis not present

## 2021-09-05 MED ORDER — AMOXICILLIN-POT CLAVULANATE 875-125 MG PO TABS: 1.0000 | ORAL_TABLET | Freq: Two times a day (BID) | ORAL | 0 refills | Status: AC

## 2021-09-05 NOTE — ED Triage Notes (Signed)
Cough since Tuesday  OTC mucinex cold & flu, cough drops, Vicks vapor rub Negative COVID  flu test w/ [facility]  Here w/ her husband

## 2021-09-06 NOTE — ED Provider Notes (Signed)
[facility]    CSN: [id number] Arrival date & time: 09/05/21  1336      History   Chief Complaint Chief Complaint  Patient presents with   Cough    HPI [name] is a 51 y.o. female.   Pt complains of a cough for 6 days.  Pt has had a negative flu and covid test.  Pt complains of congestion, cough and sinus pressure   The history is provided by the patient. No language interpreter was used.  Cough Cough characteristics:  Non-productive Sputum characteristics:  Nondescript Severity:  Moderate Onset quality:  Gradual Timing:  Constant Progression:  Worsening Chronicity:  New Relieved by:  Nothing Worsened by:  Nothing Ineffective treatments:  None tried Associated symptoms: no fever    Past Medical History:  Diagnosis Date   Uterine fibroid     There are no problems to display for this patient.   Past Surgical History:  Procedure Laterality Date   FRACTURE SURGERY  2016   ankle    OB History   No obstetric history on file.      Home Medications    Prior to Admission medications   Medication Sig Start Date End Date Taking? Authorizing Provider  amoxicillin-clavulanate (AUGMENTIN) 875-125 MG tablet Take 1 tablet by mouth 2 (two) times daily for 10 days. 09/05/21 09/15/21 Yes Caryl Ada K, PA-C  tranexamic acid (LYSTEDA) 650 MG TABS tablet tranexamic acid 650 mg tablet  TAKE 2 TABLETS BY MOUTH 3 TIMES PER DAY FOR 5 DAYS DURING MENSTRUAL CYCLE 02/10/21  Yes [provider]  fexofenadine-pseudoephedrine (ALLEGRA-D 24) 180-240 MG 24 hr tablet Take 1 tablet by mouth daily as needed.    [provider]  linaclotide (LINZESS) 145 MCG CAPS capsule TAKE 1 TABLET BEFORE FIRST MEAL OF THE DAY 90 04/14/17   [provider]  Multiple Vitamins-Minerals (MULTIVITAMIN WITH MINERALS) tablet Take 1 tablet by mouth daily.    [provider]    Family History Family History  Problem Relation Age of Onset   Thyroid  disease Mother    Macular degeneration Mother    Glaucoma Mother    Gout Father    GER disease Father    Deep vein thrombosis Father     Social History Social History   Tobacco Use   Smoking status: Never    Passive exposure: Never   Smokeless tobacco: Never  Vaping Use   Vaping Use: Never used  Substance Use Topics   Alcohol use: Yes    Alcohol/week: 2.0 standard drinks    Types: 2 Standard drinks or equivalent per week     Allergies   Bee venom, Hydrocodone, Bismuth subsalicylate, and Latex   Review of Systems Review of Systems  Constitutional:  Negative for fever.  Respiratory:  Positive for cough.   All other systems reviewed and are negative.   Physical Exam Triage Vital Signs ED Triage Vitals  Enc Vitals Group     BP 09/05/21 1400 117/81     Pulse Rate 09/05/21 1400 90     Resp 09/05/21 1400 16     Temp 09/05/21 1400 99.3 F (37.4 C)     Temp Source 09/05/21 1400 Tympanic     SpO2 09/05/21 1400 95 %     Weight 09/05/21 1404 140 lb (63.5 kg)     Height 09/05/21 1404 5\' 2"  (1.575 m)     Head Circumference --      Peak Flow --  Pain Score 09/05/21 1404 4     Pain Loc --      Pain Edu? --      Excl. in [place]? --    No data found.  Updated Vital Signs BP 117/81 (BP Location: Left Arm)    Pulse 90    Temp 99.3 F (37.4 C) (Tympanic)    Resp 16    Ht 5\' 2"  (1.575 m)    Wt 63.5 kg    LMP 09/02/2021 (Exact Date)    SpO2 95%    BMI 25.61 kg/m   Visual Acuity Right Eye Distance:   Left Eye Distance:   Bilateral Distance:    Right Eye Near:   Left Eye Near:    Bilateral Near:     Physical Exam Vitals and nursing note reviewed.  Constitutional:      Appearance: She is well-developed.  HENT:     Head: Normocephalic.     Nose: Congestion and rhinorrhea present.     Mouth/Throat:     Mouth: Mucous membranes are moist.  Eyes:     Pupils: Pupils are equal, round, and reactive to light.  Cardiovascular:     Rate and Rhythm: Normal rate.   Pulmonary:     Effort: Pulmonary effort is normal.  Abdominal:     General: Abdomen is flat. There is no distension.  Musculoskeletal:        General: Normal range of motion.     Cervical back: Normal range of motion.  Skin:    General: Skin is warm.  Neurological:     General: No focal deficit present.     Mental Status: She is alert and oriented to person, place, and time.     UC Treatments / Results  Labs (all labs ordered are listed, but only abnormal results are displayed) Labs Reviewed - No data to display  EKG   Radiology No results found.  Procedures Procedures (including critical care time)  Medications Ordered in UC Medications - No data to display  Initial Impression / Assessment and Plan / UC Course  I have reviewed the triage vital signs and the nursing notes.  Pertinent labs & imaging results that were available during my care of the patient were reviewed by me and considered in my medical decision making (see chart for details).      Final Clinical Impressions(s) / UC Diagnoses   Final diagnoses:  Acute non-recurrent ethmoidal sinusitis   Discharge Instructions   None    ED Prescriptions     Medication Sig Dispense Auth. Provider   amoxicillin-clavulanate (AUGMENTIN) 875-125 MG tablet Take 1 tablet by mouth 2 (two) times daily for 10 days. 20 tablet Fransico Meadow, [place]      PDMP not reviewed this encounter.   Fransico Meadow, [place] 09/06/21 [phone]

## 2021-09-07 ENCOUNTER — Telehealth: Payer: Self-pay | Admitting: Emergency Medicine

## 2021-09-07 NOTE — Telephone Encounter (Signed)
Call back to [facility] regarding extension on work note - RN asked [name] to call back - # left - RN to review w/ provider here today

## 2021-09-07 NOTE — Telephone Encounter (Signed)
Returned call from pt - states she feels better, but would like an extension on her work note -tessalon perles that were prescribed yesterday - chart reviewed w/ provider here today. Okay to extend work note. RN reviewed sinus rinses w/ Butch Penny regarding sinus  infections. If Landree can not access My Chart thru [facility], her husband will pick up the letter at the front desk. Pt aware her husband will need to bring a drivers license

## 2021-09-09 ENCOUNTER — Telehealth: Payer: Self-pay

## 2021-09-09 NOTE — Telephone Encounter (Signed)
Pt notified of provider recommendations. Pt verbalized understanding.

## 2021-09-24 ENCOUNTER — Emergency Department (INDEPENDENT_AMBULATORY_CARE_PROVIDER_SITE_OTHER): Payer: Managed Care, Other (non HMO)

## 2021-09-24 ENCOUNTER — Emergency Department
Admission: EM | Admit: 2021-09-24 | Discharge: 2021-09-24 | Disposition: A | Discharge status: Home / Self Care | Payer: Managed Care, Other (non HMO) | Source: Home / Self Care

## 2021-09-24 ENCOUNTER — Other Ambulatory Visit: Payer: Self-pay

## 2021-09-24 DIAGNOSIS — R059 Cough, unspecified: Secondary | ICD-10-CM

## 2021-09-24 DIAGNOSIS — J309 Allergic rhinitis, unspecified: Secondary | ICD-10-CM

## 2021-09-24 DIAGNOSIS — R0989 Other specified symptoms and signs involving the circulatory and respiratory systems: Secondary | ICD-10-CM | POA: Diagnosis not present

## 2021-09-24 MED ORDER — FEXOFENADINE HCL 180 MG PO TABS: 180.0000 mg | ORAL_TABLET | Freq: Every day | ORAL | 0 refills | Status: DC

## 2021-09-24 MED ORDER — AZITHROMYCIN 250 MG PO TABS: 250.0000 mg | ORAL_TABLET | Freq: Every day | ORAL | 0 refills | Status: DC

## 2021-09-24 MED ORDER — PROMETHAZINE-DM 6.25-15 MG/5ML PO SYRP: 5.0000 mL | ORAL_SOLUTION | Freq: Two times a day (BID) | ORAL | 0 refills | Status: DC | PRN

## 2021-09-24 MED ORDER — BENZONATATE 200 MG PO CAPS: 200.0000 mg | ORAL_CAPSULE | Freq: Three times a day (TID) | ORAL | 0 refills | Status: AC | PRN

## 2021-09-24 NOTE — ED Triage Notes (Signed)
Pt states that she was seen on 09/05/21.  Pt states that she still has a cough.  Pt states that the cough is keeping her up at night. Pt states that the cough has her gagging and some urinary incontinence.    Pt states that she is vaccinated for covid. Pt states that she is unsure of flu vaccine.

## 2021-09-24 NOTE — Discharge Instructions (Addendum)
Advised patient to take medication as directed with food to completion.  Advised patient to take Allegra with Zithromax daily for the next 5 days.  Advised may use Allegra as needed afterwards for concurrent postnasal drip/drainage.  Advised patient may use Tessalon Perles daily or as needed for cough.  Advised patient may use Promethazine DM in early evening or prior to sleep due to sedative effects.  Advised patient not to use Promethazine DM and Tessalon Perles together.  Encouraged patient to increase daily water intake while taking this medication.

## 2021-09-24 NOTE — ED Provider Notes (Signed)
[name]    CSN: [id number] Arrival date & time: 09/24/21  0911      History   Chief Complaint Chief Complaint  Patient presents with   Cough    Cough x1 week    HPI [name] is a 51 y.o. female.   HPI 51 year old female presents with cough for 1 week patient was evaluated here on 09/05/2021 for sinusitis and was prescribed Augmentin twice daily for 10 days.  Patient reports still dealing with cough.  Patient request Z-Pak this morning.  Past Medical History:  Diagnosis Date   Uterine fibroid     There are no problems to display for this patient.   Past Surgical History:  Procedure Laterality Date   FRACTURE SURGERY  2016   ankle    OB History   No obstetric history on file.      Home Medications    Prior to Admission medications   Medication Sig Start Date End Date Taking? Authorizing Provider  azithromycin (ZITHROMAX) 250 MG tablet Take 1 tablet (250 mg total) by mouth daily. Take first 2 tablets together, then 1 every day until finished. 09/24/21  Yes Eliezer Lofts, FNP  benzonatate (TESSALON) 200 MG capsule Take 1 capsule (200 mg total) by mouth 3 (three) times daily as needed for up to 7 days for cough. 09/24/21 10/01/21 Yes Eliezer Lofts, FNP  fexofenadine [facility] ALLERGY) 180 MG tablet Take 1 tablet (180 mg total) by mouth daily for 15 days. 09/24/21 10/09/21 Yes Eliezer Lofts, FNP  linaclotide (LINZESS) 145 MCG CAPS capsule TAKE 1 TABLET BEFORE FIRST MEAL OF THE DAY 90 04/14/17  Yes [provider]  Multiple Vitamins-Minerals (MULTIVITAMIN WITH MINERALS) tablet Take 1 tablet by mouth daily.   Yes [provider]  promethazine-dextromethorphan (PROMETHAZINE-DM) 6.25-15 MG/5ML syrup Take 5 mLs by mouth 2 (two) times daily as needed for cough. 09/24/21  Yes Eliezer Lofts, FNP  tranexamic acid (LYSTEDA) 650 MG TABS tablet tranexamic acid 650 mg tablet  TAKE 2 TABLETS BY MOUTH 3 TIMES PER DAY FOR 5 DAYS DURING MENSTRUAL  CYCLE 02/10/21  Yes [provider]    Family History Family History  Problem Relation Age of Onset   Thyroid disease Mother    Macular degeneration Mother    Glaucoma Mother    Gout Father    GER disease Father    Deep vein thrombosis Father     Social History Social History   Tobacco Use   Smoking status: Never    Passive exposure: Never   Smokeless tobacco: Never  Vaping Use   Vaping Use: Never used  Substance Use Topics   Alcohol use: Yes    Alcohol/week: 2.0 standard drinks    Types: 2 Standard drinks or equivalent per week     Allergies   Bee venom, Hydrocodone, Bismuth subsalicylate, and Latex   Review of Systems Review of Systems  Respiratory:  Positive for cough.   All other systems reviewed and are negative.   Physical Exam Triage Vital Signs ED Triage Vitals  Enc Vitals Group     BP 09/24/21 0942 (!) 137/91     Pulse Rate 09/24/21 0942 68     Resp 09/24/21 0942 20     Temp 09/24/21 0942 98.7 F (37.1 C)     Temp Source 09/24/21 0942 Oral     SpO2 09/24/21 0942 99 %     Weight 09/24/21 0939 140 lb (63.5 kg)     Height 09/24/21 0939  5\' 2"  (1.575 m)     Head Circumference --      Peak Flow --      Pain Score 09/24/21 0939 0     Pain Loc --      Pain Edu? --      Excl. in [place]? --    No data found.  Updated Vital Signs BP (!) 137/91 (BP Location: Right Arm)    Pulse 68    Temp 98.7 F (37.1 C) (Oral)    Resp 20    Ht 5\' 2"  (1.575 m)    Wt 140 lb (63.5 kg)    LMP 09/02/2021 (Exact Date)    SpO2 99%    BMI 25.61 kg/m    Physical Exam Vitals and nursing note reviewed.  Constitutional:      General: She is not in acute distress.    Appearance: Normal appearance. She is normal weight. She is ill-appearing.  HENT:     Head: Normocephalic and atraumatic.     Right Ear: Tympanic membrane, ear canal and external ear normal.     Left Ear: Tympanic membrane, ear canal and external ear normal.     Mouth/Throat:     Mouth: Mucous membranes  are moist.     Pharynx: Oropharynx is clear.     Comments: Moderate amount of clear drainage of posterior oropharynx noted Eyes:     Extraocular Movements: Extraocular movements intact.     Conjunctiva/sclera: Conjunctivae normal.     Pupils: Pupils are equal, round, and reactive to light.  Cardiovascular:     Rate and Rhythm: Normal rate and regular rhythm.     Pulses: Normal pulses.     Heart sounds: Normal heart sounds.  Pulmonary:     Effort: Pulmonary effort is normal.     Breath sounds: Normal breath sounds. No wheezing, rhonchi or rales.     Comments: Infrequent nonproductive cough noted on exam Musculoskeletal:     Cervical back: Normal range of motion and neck supple.  Skin:    General: Skin is warm and dry.  Neurological:     General: No focal deficit present.     Mental Status: She is alert and oriented to person, place, and time.     UC Treatments / Results  Labs (all labs ordered are listed, but only abnormal results are displayed) Labs Reviewed - No data to display  EKG   Radiology No results found.  Procedures Procedures (including critical care time)  Medications Ordered in UC Medications - No data to display  Initial Impression / Assessment and Plan / UC Course  I have reviewed the triage vital signs and the nursing notes.  Pertinent labs & imaging results that were available during my care of the patient were reviewed by me and considered in my medical decision making (see chart for details).     MDM: 1.  Cough-CXR revealed above no acute cardiopulmonary process, Rx'd Zithromax, Tessalon Perles, Promethazine DM. 2.  Allergic rhinitis-Rx'd Allegra. Advised patient to take medication as directed with food to completion.  Advised patient to take Allegra with Zithromax daily for the next 5 days.  Advised may use Allegra as needed afterwards for concurrent postnasal drip/drainage.  Advised patient may use Tessalon Perles daily or as needed for cough.   Advised patient may use Promethazine DM in early evening or prior to sleep due to sedative effects.  Advised patient not to use Promethazine DM and Tessalon Perles together.  Encouraged patient to increase  daily water intake while taking this medication.  Patient discharged home, hemodynamically stable. Final Clinical Impressions(s) / UC Diagnoses   Final diagnoses:  Cough, unspecified type  Allergic rhinitis, unspecified seasonality, unspecified trigger     Discharge Instructions      Advised patient to take medication as directed with food to completion.  Advised patient to take Allegra with Zithromax daily for the next 5 days.  Advised may use Allegra as needed afterwards for concurrent postnasal drip/drainage.  Advised patient may use Tessalon Perles daily or as needed for cough.  Advised patient may use Promethazine DM in early evening or prior to sleep due to sedative effects.  Advised patient not to use Promethazine DM and Tessalon Perles together.  Encouraged patient to increase daily water intake while taking this medication.     ED Prescriptions     Medication Sig Dispense Auth. Provider   azithromycin (ZITHROMAX) 250 MG tablet Take 1 tablet (250 mg total) by mouth daily. Take first 2 tablets together, then 1 every day until finished. 6 tablet Eliezer Lofts, FNP   fexofenadine [facility] ALLERGY) 180 MG tablet Take 1 tablet (180 mg total) by mouth daily for 15 days. 15 tablet Eliezer Lofts, FNP   benzonatate (TESSALON) 200 MG capsule Take 1 capsule (200 mg total) by mouth 3 (three) times daily as needed for up to 7 days for cough. 40 capsule Eliezer Lofts, FNP   promethazine-dextromethorphan (PROMETHAZINE-DM) 6.25-15 MG/5ML syrup Take 5 mLs by mouth 2 (two) times daily as needed for cough. 118 mL Eliezer Lofts, FNP      PDMP not reviewed this encounter.   Eliezer Lofts, [place] 09/24/21 1051

## 2022-03-21 ENCOUNTER — Ambulatory Visit
Admission: EM | Admit: 2022-03-21 | Discharge: 2022-03-21 | Disposition: A | Discharge status: Home / Self Care | Payer: Managed Care, Other (non HMO) | Attending: Family Medicine | Admitting: Family Medicine

## 2022-03-21 DIAGNOSIS — J0100 Acute maxillary sinusitis, unspecified: Secondary | ICD-10-CM | POA: Diagnosis not present

## 2022-03-21 MED ORDER — AMOXICILLIN-POT CLAVULANATE 875-125 MG PO TABS: 1.0000 | ORAL_TABLET | Freq: Two times a day (BID) | ORAL | 0 refills | Status: AC

## 2022-03-21 NOTE — ED Triage Notes (Signed)
Pt c/o runny nose and sinus congestion. Also states tongue feels "raw". Headache yesterday. Allegra D and Alkaseltzer prn. Denies fever. Hx of sinus infections.

## 2022-03-21 NOTE — ED Provider Notes (Signed)
[facility]    CSN: [id number] Arrival date & time: 03/21/22  1053      History   Chief Complaint Chief Complaint  Patient presents with   Sinus issues    HPI ROMONIA YANIK is a 51 y.o. female.   HPI 51 year old female presents with runny nose, headache, sinus nasal congestion and feels like her tongue is raw for 4 to 5 days.  Reports history of sinus infections.  Past Medical History:  Diagnosis Date   Uterine fibroid     There are no problems to display for this patient.   Past Surgical History:  Procedure Laterality Date   FRACTURE SURGERY  2016   ankle    OB History   No obstetric history on file.      Home Medications    Prior to Admission medications   Medication Sig Start Date End Date Taking? Authorizing Provider  amoxicillin-clavulanate (AUGMENTIN) 875-125 MG tablet Take 1 tablet by mouth 2 (two) times daily for 7 days. 03/21/22 03/28/22 Yes Eliezer Lofts, FNP  fexofenadine [facility] ALLERGY) 180 MG tablet Take 1 tablet (180 mg total) by mouth daily for 15 days. 09/24/21 10/09/21  Eliezer Lofts, FNP  linaclotide (LINZESS) 145 MCG CAPS capsule TAKE 1 TABLET BEFORE FIRST MEAL OF THE DAY 90 04/14/17   [provider]  Multiple Vitamins-Minerals (MULTIVITAMIN WITH MINERALS) tablet Take 1 tablet by mouth daily.    [provider]  tranexamic acid (LYSTEDA) 650 MG TABS tablet tranexamic acid 650 mg tablet  TAKE 2 TABLETS BY MOUTH 3 TIMES PER DAY FOR 5 DAYS DURING MENSTRUAL CYCLE 02/10/21   [provider]    Family History Family History  Problem Relation Age of Onset   Thyroid disease Mother    Macular degeneration Mother    Glaucoma Mother    Gout Father    GER disease Father    Deep vein thrombosis Father     Social History Social History   Tobacco Use   Smoking status: Never    Passive exposure: Never   Smokeless tobacco: Never  Vaping Use   Vaping Use: Never used  Substance Use Topics   Alcohol use:  Yes    Alcohol/week: 2.0 standard drinks of alcohol    Types: 2 Standard drinks or equivalent per week     Allergies   Bee venom, Hydrocodone, Bismuth subsalicylate, and Latex   Review of Systems Review of Systems  HENT:  Positive for congestion, postnasal drip, sinus pressure and sore throat.   All other systems reviewed and are negative.    Physical Exam Triage Vital Signs ED Triage Vitals  Enc Vitals Group     BP 03/21/22 1109 137/87     Pulse Rate 03/21/22 1109 70     Resp 03/21/22 1109 17     Temp 03/21/22 1109 98.6 F (37 C)     Temp Source 03/21/22 1109 Oral     SpO2 03/21/22 1109 100 %     Weight --      Height --      Head Circumference --      Peak Flow --      Pain Score 03/21/22 1113 0     Pain Loc --      Pain Edu? --      Excl. in [place]? --    No data found.  Updated Vital Signs BP 137/87 (BP Location: Right Arm)   Pulse 70   Temp 98.6 F (37 C) (  Oral)   Resp 17   LMP 03/17/2022 (Approximate)   SpO2 100%    Physical Exam Vitals and nursing note reviewed.  Constitutional:      General: She is not in acute distress.    Appearance: Normal appearance. She is normal weight. She is not ill-appearing.  HENT:     Head: Normocephalic and atraumatic.     Right Ear: Tympanic membrane and external ear normal.     Left Ear: Tympanic membrane and external ear normal.     Ears:     Comments: Moderate eustachian tube dysfunction noted bilaterally    Nose:     Comments: Turbinates are erythematous/edematous    Mouth/Throat:     Mouth: Mucous membranes are moist.     Pharynx: Oropharynx is clear.     Comments: Mild amount of clear drainage noted of posterior oropharynx Eyes:     Extraocular Movements: Extraocular movements intact.     Conjunctiva/sclera: Conjunctivae normal.     Pupils: Pupils are equal, round, and reactive to light.  Cardiovascular:     Rate and Rhythm: Normal rate and regular rhythm.     Pulses: Normal pulses.     Heart sounds:  Normal heart sounds.  Pulmonary:     Effort: Pulmonary effort is normal.     Breath sounds: Normal breath sounds. No wheezing, rhonchi or rales.  Musculoskeletal:     Cervical back: Normal range of motion and neck supple.  Skin:    General: Skin is warm and dry.  Neurological:     General: No focal deficit present.     Mental Status: She is alert and oriented to person, place, and time. Mental status is at baseline.      UC Treatments / Results  Labs (all labs ordered are listed, but only abnormal results are displayed) Labs Reviewed - No data to display  EKG   Radiology No results found.  Procedures Procedures (including critical care time)  Medications Ordered in UC Medications - No data to display  Initial Impression / Assessment and Plan / UC Course  I have reviewed the triage vital signs and the nursing notes.  Pertinent labs & imaging results that were available during my care of the patient were reviewed by me and considered in my medical decision making (see chart for details).     MDM: 1.  Subacute maxillary sinusitis-Rx'd Augmentin. Advised patient to take medication as directed with food to completion.  Encouraged patient to increase daily water intake while taking this medication.  Advised if symptoms worsen and/or unresolved please follow-up with PCP or here for further evaluation.  Work note provided to patient prior to discharge.  Patient discharged home, hemodynamically stable. Final Clinical Impressions(s) / UC Diagnoses   Final diagnoses:  Subacute maxillary sinusitis     Discharge Instructions      Advised patient to take medication as directed with food to completion.  Encouraged patient to increase daily water intake while taking this medication.  Advised if symptoms worsen and/or unresolved please follow-up with PCP or here for further evaluation.     ED Prescriptions     Medication Sig Dispense Auth. Provider   amoxicillin-clavulanate  (AUGMENTIN) 875-125 MG tablet Take 1 tablet by mouth 2 (two) times daily for 7 days. 14 tablet Eliezer Lofts, FNP      PDMP not reviewed this encounter.   Eliezer Lofts, [place] 03/21/22 1208

## 2022-03-21 NOTE — Discharge Instructions (Addendum)
Advised patient to take medication as directed with food to completion.  Encouraged patient to increase daily water intake while taking this medication.  Advised if symptoms worsen and/or unresolved please follow-up with PCP or here for further evaluation. 

## 2022-03-31 ENCOUNTER — Telehealth: Payer: Self-pay | Admitting: Emergency Medicine

## 2022-03-31 NOTE — Telephone Encounter (Signed)
Returned patient's call, states that she complete the antbs prescribed 10 days ago and she is no better.  Advised that she would have to be re-evaluated here in our office or follow up with her PCP.  Patient voices understanding.

## 2022-04-03 ENCOUNTER — Ambulatory Visit
Admission: RE | Admit: 2022-04-03 | Discharge: 2022-04-03 | Disposition: A | Discharge status: Home / Self Care | Payer: Managed Care, Other (non HMO) | Source: Ambulatory Visit | Attending: Family Medicine | Admitting: Family Medicine

## 2022-04-03 VITALS — BP 127/84 | HR 60 | Temp 98.9°F | Resp 18

## 2022-04-03 DIAGNOSIS — J0101 Acute recurrent maxillary sinusitis: Secondary | ICD-10-CM

## 2022-04-03 MED ORDER — PREDNISONE 20 MG PO TABS: ORAL_TABLET | ORAL | 0 refills | Status: DC

## 2022-04-03 MED ORDER — DOXYCYCLINE HYCLATE 100 MG PO CAPS: 100.0000 mg | ORAL_CAPSULE | Freq: Two times a day (BID) | ORAL | 0 refills | Status: DC

## 2022-04-03 MED ORDER — PROMETHAZINE-DM 6.25-15 MG/5ML PO SYRP: 5.0000 mL | ORAL_SOLUTION | Freq: Four times a day (QID) | ORAL | 0 refills | Status: DC | PRN

## 2022-04-03 NOTE — ED Triage Notes (Signed)
Patient c/o sinus drainage, bilateral ear discomfort (ears popping) feels like she's in a tunnel.  Patient was seen last week, completed meds given, no better.

## 2022-04-03 NOTE — ED Provider Notes (Signed)
[name]    CSN: [id number] Arrival date & time: 04/03/22  0955      History   Chief Complaint Chief Complaint  Patient presents with   Anxiety    Still having sinus issues after finishing antibiotics.  Came in last Monday for an appointment.  Also mouth has no taste. - Entered by patient   Appointment    HPI [name] is a 51 y.o. female.   HPI Patient is here with her husband for appointment.  She is here for follow-up of her visit of 03/21/2022.  She states that she has been sick most of the month of August with allergies and sinus congestion, postnasal drip, pressure and pain in her sinuses, cough, ear discomfort and popping.  She completed the Augmentin prescribed at last visit with no improvement.  She had had symptoms for approximately 4 days before being started on Augmentin.  I voiced the possibility that she had a viral infection to start with and that the Augmentin did not help for this reason.  She does have underlying seasonal allergies and takes Allegra D.  It is not helping her current symptoms.  Her husband voices that her cough is keeping him awake at night. Past Medical History:  Diagnosis Date   Uterine fibroid     There are no problems to display for this patient.   Past Surgical History:  Procedure Laterality Date   FRACTURE SURGERY  2016   ankle    OB History   No obstetric history on file.      Home Medications    Prior to Admission medications   Medication Sig Start Date End Date Taking? Authorizing Provider  doxycycline (VIBRAMYCIN) 100 MG capsule Take 1 capsule (100 mg total) by mouth 2 (two) times daily. 04/03/22  Yes Raylene Everts, MD  linaclotide [facility]) 145 MCG CAPS capsule TAKE 1 TABLET BEFORE FIRST MEAL OF THE DAY 90 04/14/17  Yes [provider]  Multiple Vitamins-Minerals (MULTIVITAMIN WITH MINERALS) tablet Take 1 tablet by mouth daily.   Yes [provider]  predniSONE (DELTASONE) 20 MG  tablet Take 2 pills a day for 5 days.  Then take 1 a day for 5 days.  Then discontinue.  take with food. 04/03/22  Yes Raylene Everts, MD  promethazine-dextromethorphan (PROMETHAZINE-DM) 6.25-15 MG/5ML syrup Take 5 mLs by mouth 4 (four) times daily as needed for cough. 04/03/22  Yes Raylene Everts, MD  tranexamic acid (LYSTEDA) 650 MG TABS tablet tranexamic acid 650 mg tablet  TAKE 2 TABLETS BY MOUTH 3 TIMES PER DAY FOR 5 DAYS DURING MENSTRUAL CYCLE 02/10/21  Yes [provider]  fexofenadine (ALLEGRA ALLERGY) 180 MG tablet Take 1 tablet (180 mg total) by mouth daily for 15 days. 09/24/21 10/09/21  Eliezer Lofts, FNP    Family History Family History  Problem Relation Age of Onset   Thyroid disease Mother    Macular degeneration Mother    Glaucoma Mother    Gout Father    GER disease Father    Deep vein thrombosis Father     Social History Social History   Tobacco Use   Smoking status: Never    Passive exposure: Never   Smokeless tobacco: Never  Vaping Use   Vaping Use: Never used  Substance Use Topics   Alcohol use: Yes    Alcohol/week: 2.0 standard drinks of alcohol    Types: 2 Standard drinks or equivalent per week     Allergies  Bee venom, Hydrocodone, Bismuth subsalicylate, and Latex   Review of Systems Review of Systems See HPI  Physical Exam Triage Vital Signs ED Triage Vitals  Enc Vitals Group     BP 04/03/22 1017 127/84     Pulse Rate 04/03/22 1017 60     Resp 04/03/22 1017 18     Temp 04/03/22 1017 98.9 F (37.2 C)     Temp Source 04/03/22 1017 Oral     SpO2 04/03/22 1017 97 %     Weight --      Height --      Head Circumference --      Peak Flow --      Pain Score 04/03/22 1018 0     Pain Loc --      Pain Edu? --      Excl. in [place]? --    No data found.  Updated Vital Signs BP 127/84 (BP Location: Left Arm)   Pulse 60   Temp 98.9 F (37.2 C) (Oral)   Resp 18   LMP 03/17/2022 (Approximate)   SpO2 97%     Physical  Exam Constitutional:      General: She is not in acute distress.    Appearance: She is well-developed. She is ill-appearing.  HENT:     Head: Normocephalic and atraumatic.     Right Ear: Tympanic membrane and ear canal normal.     Left Ear: Tympanic membrane and ear canal normal.     Nose: Congestion present.     Comments: Nasal membranes are red and swollen Eyes:     Conjunctiva/sclera: Conjunctivae normal.     Pupils: Pupils are equal, round, and reactive to light.  Cardiovascular:     Rate and Rhythm: Normal rate and regular rhythm.     Heart sounds: Normal heart sounds.  Pulmonary:     Effort: Pulmonary effort is normal. No respiratory distress.     Breath sounds: Rhonchi present.  Abdominal:     General: There is no distension.     Palpations: Abdomen is soft.  Musculoskeletal:        General: Normal range of motion.     Cervical back: Normal range of motion.  Lymphadenopathy:     Cervical: Cervical adenopathy present.  Skin:    General: Skin is warm and dry.  Neurological:     General: No focal deficit present.     Mental Status: She is alert.  Psychiatric:        Mood and Affect: Mood normal.        Behavior: Behavior normal.      UC Treatments / Results  Labs (all labs ordered are listed, but only abnormal results are displayed) Labs Reviewed - No data to display  EKG   Radiology No results found.  Procedures Procedures (including critical care time)  Medications Ordered in UC Medications - No data to display  Initial Impression / Assessment and Plan / UC Course  I have reviewed the triage vital signs and the nursing notes.  Pertinent labs & imaging results that were available during my care of the patient were reviewed by me and considered in my medical decision making (see chart for details).     I informed the patient that she could just have a post bronchitic cough from a viral bronchitis.  She does have environmental allergies.  They are not  improving with Allegra.  She could also have an atypical infection not improved with Augmentin.  Because  she has been sick for the better part of 3 weeks we will try a different antibiotic, course of prednisone, continue Allegra, see ENT if fails to improve Final Clinical Impressions(s) / UC Diagnoses   Final diagnoses:  Acute recurrent maxillary sinusitis     Discharge Instructions      Take the doxycycline antibiotic 2 x a day.  Take with food.  Take 2 doses today Take the prednisone once a day in the morning Drink lots of fluids Run a humidifier in the bedroom if you have one Cough medication as needed Continue allegra and rinses See ENT if you fail to improve   ED Prescriptions     Medication Sig Dispense Auth. Provider   doxycycline (VIBRAMYCIN) 100 MG capsule Take 1 capsule (100 mg total) by mouth 2 (two) times daily. 20 capsule Raylene Everts, MD   predniSONE (DELTASONE) 20 MG tablet Take 2 pills a day for 5 days.  Then take 1 a day for 5 days.  Then discontinue.  take with food. 15 tablet Raylene Everts, MD   promethazine-dextromethorphan (PROMETHAZINE-DM) 6.25-15 MG/5ML syrup Take 5 mLs by mouth 4 (four) times daily as needed for cough. 118 mL Raylene Everts, MD      PDMP not reviewed this encounter.   Raylene Everts, MD 04/03/22 [phone]

## 2022-04-03 NOTE — Discharge Instructions (Signed)
Take the doxycycline antibiotic 2 x a day.  Take with food.  Take 2 doses today Take the prednisone once a day in the morning Drink lots of fluids Run a humidifier in the bedroom if you have one Cough medication as needed Continue allegra and rinses See ENT if you fail to improve

## 2022-04-04 ENCOUNTER — Telehealth: Payer: Self-pay

## 2022-04-04 NOTE — Telephone Encounter (Signed)
Return call to pt requesting work note. 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

## 2022-08-22 DIAGNOSIS — D259 Leiomyoma of uterus, unspecified: Secondary | ICD-10-CM | POA: Insufficient documentation

## 2022-09-09 DIAGNOSIS — M5116 Intervertebral disc disorders with radiculopathy, lumbar region: Secondary | ICD-10-CM | POA: Insufficient documentation

## 2022-09-27 DIAGNOSIS — K5904 Chronic idiopathic constipation: Secondary | ICD-10-CM | POA: Insufficient documentation

## 2022-10-17 IMAGING — DX DG CHEST 2V
2 series · 2 of 2 positions shown · non-contrast
Comparison: No priors.

CLINICAL DATA: 51-year-old female with history of cough and
congestion.

EXAM:
CHEST - 2 VIEW

[chest pa]
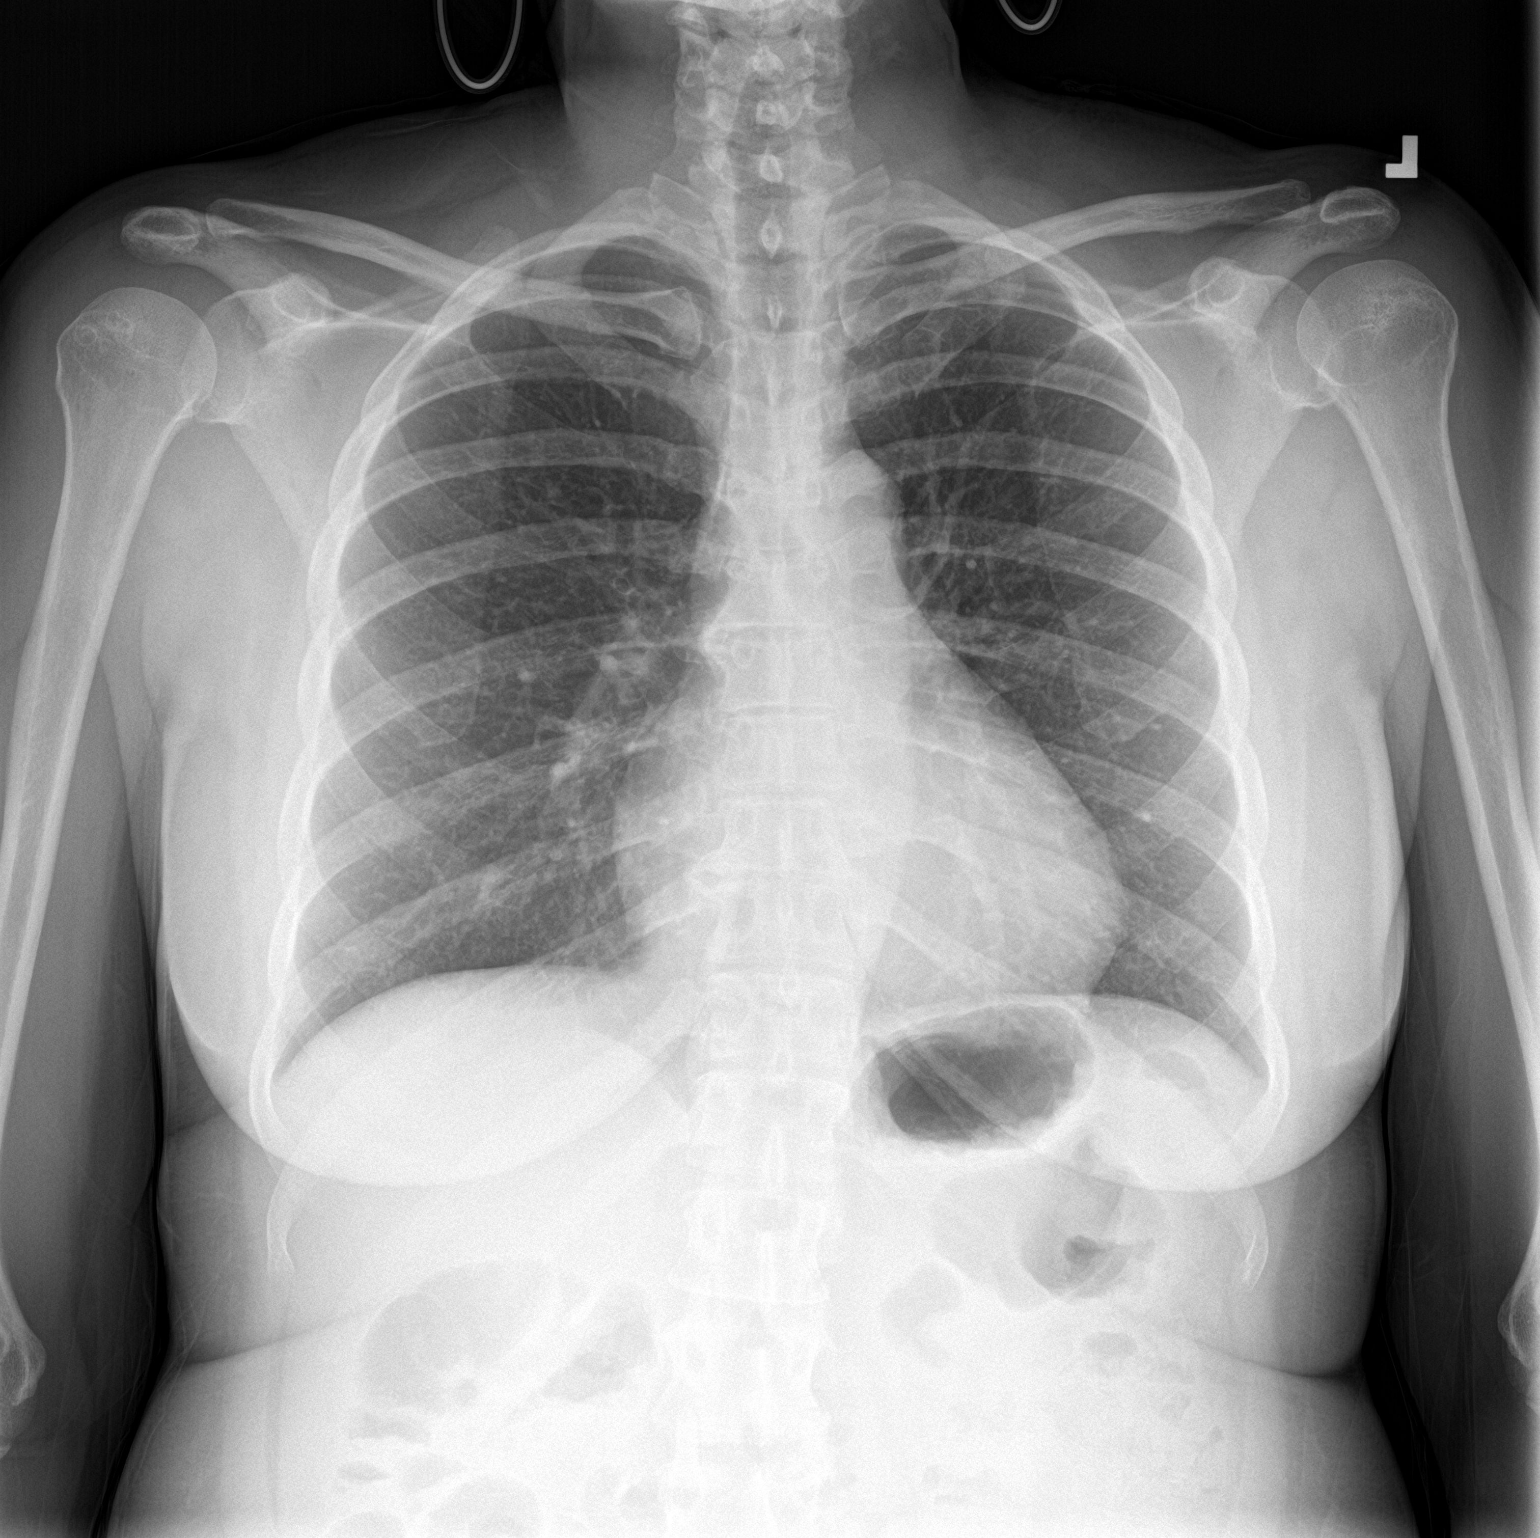

[chest lat]
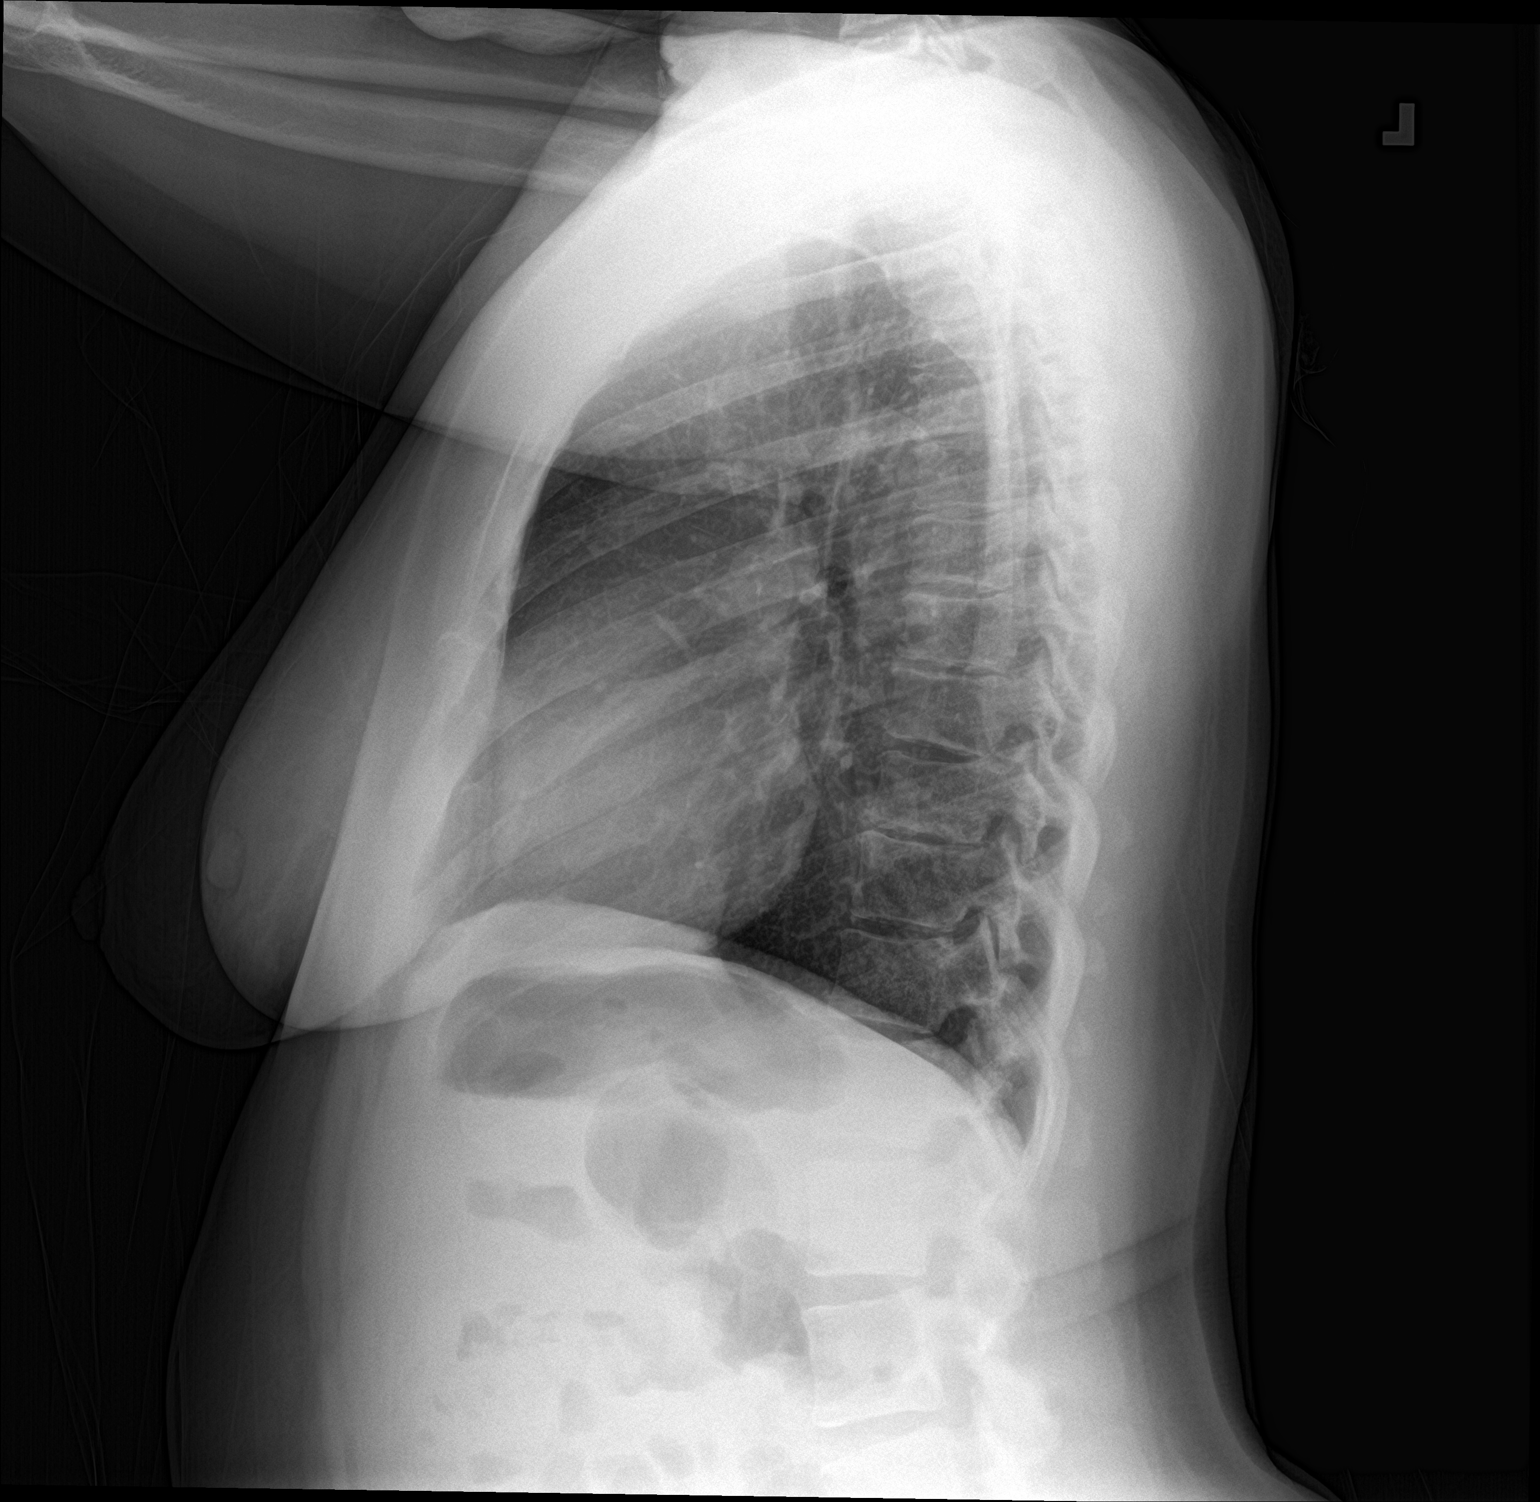

[2 of 2 positions shown; findings below may reference images not displayed]

FINDINGS: Lung volumes are normal. No consolidative airspace disease. No
pleural effusions. No pneumothorax. No pulmonary nodule or mass
noted. Pulmonary vasculature and the cardiomediastinal silhouette
are within normal limits.
IMPRESSION: No radiographic evidence of acute cardiopulmonary disease.

## 2023-01-17 ENCOUNTER — Ambulatory Visit
Admission: EM | Admit: 2023-01-17 | Discharge: 2023-01-17 | Disposition: A | Discharge status: Home / Self Care | Payer: Managed Care, Other (non HMO) | Attending: Family Medicine | Admitting: Family Medicine

## 2023-01-17 ENCOUNTER — Other Ambulatory Visit: Payer: Self-pay

## 2023-01-17 DIAGNOSIS — L500 Allergic urticaria: Secondary | ICD-10-CM

## 2023-01-17 DIAGNOSIS — R21 Rash and other nonspecific skin eruption: Secondary | ICD-10-CM | POA: Diagnosis not present

## 2023-01-17 MED ORDER — METHYLPREDNISOLONE ACETATE 80 MG/ML IJ SUSP
80.0000 mg | Freq: Once | INTRAMUSCULAR | Status: AC
  Administered 2023-01-17: 80 mg via INTRAMUSCULAR

## 2023-01-17 MED ORDER — FEXOFENADINE HCL 180 MG PO TABS: 180.0000 mg | ORAL_TABLET | Freq: Every day | ORAL | 0 refills | Status: DC

## 2023-01-17 NOTE — ED Triage Notes (Addendum)
Pt c/o itchy hives since Friday. Benedryl and triamcinolone prn. Allergy  testing recently, neg for any allergies.

## 2023-01-17 NOTE — ED Provider Notes (Signed)
[name]    CSN: [id number] Arrival date & time: 01/17/23  1944      History   Chief Complaint Chief Complaint  Patient presents with   Urticaria    HPI [name] is a 52 y.o. female.   HPI 52 year old female presents with  pruritic urticaria for 4 days.  PMH significant for uterine fibroids.  Past Medical History:  Diagnosis Date   Uterine fibroid     There are no problems to display for this patient.   Past Surgical History:  Procedure Laterality Date   FRACTURE SURGERY  2016   ankle    OB History   No obstetric history on file.      Home Medications    Prior to Admission medications   Medication Sig Start Date End Date Taking? Authorizing Provider  fexofenadine (ALLEGRA ALLERGY) 180 MG tablet Take 1 tablet (180 mg total) by mouth daily for 15 days. 01/17/23 02/01/23 Yes Trevor Iha, FNP  linaclotide (LINZESS) 145 MCG CAPS capsule TAKE 1 TABLET BEFORE FIRST MEAL OF THE DAY 90 04/14/17   [provider]  Multiple Vitamins-Minerals (MULTIVITAMIN WITH MINERALS) tablet Take 1 tablet by mouth daily.    [provider]  promethazine-dextromethorphan (PROMETHAZINE-DM) 6.25-15 MG/5ML syrup Take 5 mLs by mouth 4 (four) times daily as needed for cough. 04/03/22   Eustace Moore, MD  tranexamic acid (LYSTEDA) 650 MG TABS tablet tranexamic acid 650 mg tablet  TAKE 2 TABLETS BY MOUTH 3 TIMES PER DAY FOR 5 DAYS DURING MENSTRUAL CYCLE 02/10/21   [provider]    Family History Family History  Problem Relation Age of Onset   Thyroid disease Mother    Macular degeneration Mother    Glaucoma Mother    Gout Father    GER disease Father    Deep vein thrombosis Father     Social History Social History   Tobacco Use   Smoking status: Never    Passive exposure: Never   Smokeless tobacco: Never  Vaping Use   Vaping Use: Never used  Substance Use Topics   Alcohol use: Yes    Alcohol/week: 2.0 standard drinks of  alcohol    Types: 2 Standard drinks or equivalent per week     Allergies   Bee venom, Hydrocodone, Bismuth subsalicylate, and Latex   Review of Systems Review of Systems  Skin:  Positive for rash.     Physical Exam Triage Vital Signs ED Triage Vitals  Enc Vitals Group     BP      Pulse      Resp      Temp      Temp src      SpO2      Weight      Height      Head Circumference      Peak Flow      Pain Score      Pain Loc      Pain Edu?      Excl. in [place]?    No data found.  Updated Vital Signs BP (!) 153/93 (BP Location: Right Arm)   Pulse 76   Temp 97.9 F (36.6 C) (Oral)   Resp 17   SpO2 96%    Physical Exam Vitals and nursing note reviewed.  Constitutional:      General: She is not in acute distress.    Appearance: Normal appearance. She is normal weight. She is not ill-appearing.  HENT:  Head: Normocephalic and atraumatic.     Mouth/Throat:     Mouth: Mucous membranes are moist.     Pharynx: Oropharynx is clear.  Eyes:     Extraocular Movements: Extraocular movements intact.     Conjunctiva/sclera: Conjunctivae normal.     Pupils: Pupils are equal, round, and reactive to light.  Cardiovascular:     Rate and Rhythm: Normal rate and regular rhythm.     Pulses: Normal pulses.     Heart sounds: Normal heart sounds. No murmur heard. Pulmonary:     Effort: Pulmonary effort is normal.     Breath sounds: Normal breath sounds. No wheezing, rhonchi or rales.  Musculoskeletal:        General: Normal range of motion.     Cervical back: Normal range of motion and neck supple.  Skin:    General: Skin is warm and dry.     Comments: Right lower leg (medial aspect)/left lower leg (medial aspect): Erythematous pruritic urticarial-like rash/eruption-please see images below  Neurological:     General: No focal deficit present.     Mental Status: She is alert and oriented to person, place, and time. Mental status is at baseline.  Psychiatric:        Mood and  Affect: Mood normal.        Behavior: Behavior normal.         UC Treatments / Results  Labs (all labs ordered are listed, but only abnormal results are displayed) Labs Reviewed - No data to display  EKG   Radiology No results found.  Procedures Procedures (including critical care time)  Medications Ordered in UC Medications  methylPREDNISolone acetate (DEPO-MEDROL) injection 80 mg (80 mg Intramuscular Given 01/17/23 2003)    Initial Impression / Assessment and Plan / UC Course  I have reviewed the triage vital signs and the nursing notes.  Pertinent labs & imaging results that were available during my care of the patient were reviewed by me and considered in my medical decision making (see chart for details).     MDM: 1.  Rash and nonspecific skin eruption-IM Depo-Medrol given once in clinic and prior to discharge; 2.  Allergic urticaria-Rx'd Allegra 180 mg daily for the next 5 days, as needed for urticaria. Advised patient to take Allegra daily for the next 5 days.  Advised may use as needed afterwards for urticaria/hives.  Encouraged increase daily water intake to 64 ounces per day while taking this medication.  Advised if symptoms worsen and/or unresolved please follow-up PCP, dermatology, or here for further evaluation.  Patient discharged home, hemodynamically stable.  Final Clinical Impressions(s) / UC Diagnoses   Final diagnoses:  Allergic urticaria  Rash and nonspecific skin eruption     Discharge Instructions      Advised patient to take Allegra daily for the next 5 days.  Advised may use as needed afterwards for urticaria/hives.  Encouraged increase daily water intake to 64 ounces per day while taking this medication.  Advised if symptoms worsen and/or unresolved please follow-up PCP, dermatology, or here for further evaluation.     ED Prescriptions     Medication Sig Dispense Auth. Provider   fexofenadine (ALLEGRA ALLERGY) 180 MG tablet Take 1 tablet  (180 mg total) by mouth daily for 15 days. 15 tablet Trevor Iha, FNP      PDMP not reviewed this encounter.   Trevor Iha, FNP 01/17/23 2014

## 2023-01-17 NOTE — Discharge Instructions (Addendum)
Advised patient to take Allegra daily for the next 5 days.  Advised may use as needed afterwards for urticaria/hives.  Encouraged increase daily water intake to 64 ounces per day while taking this medication.  Advised if symptoms worsen and/or unresolved please follow-up PCP, dermatology, or here for further evaluation.

## 2023-04-06 ENCOUNTER — Ambulatory Visit
Admission: EM | Admit: 2023-04-06 | Discharge: 2023-04-06 | Disposition: A | Discharge status: Home / Self Care | Payer: Managed Care, Other (non HMO) | Attending: Urgent Care | Admitting: Urgent Care

## 2023-04-06 ENCOUNTER — Other Ambulatory Visit: Payer: Self-pay

## 2023-04-06 ENCOUNTER — Encounter: Payer: Self-pay | Admitting: *Deleted

## 2023-04-06 DIAGNOSIS — R5383 Other fatigue: Secondary | ICD-10-CM | POA: Diagnosis not present

## 2023-04-06 DIAGNOSIS — Z1152 Encounter for screening for COVID-19: Secondary | ICD-10-CM | POA: Insufficient documentation

## 2023-04-06 DIAGNOSIS — J029 Acute pharyngitis, unspecified: Secondary | ICD-10-CM

## 2023-04-06 DIAGNOSIS — Z9189 Other specified personal risk factors, not elsewhere classified: Secondary | ICD-10-CM

## 2023-04-06 LAB — POC SARS CORONAVIRUS 2 AG -  ED: SARS Coronavirus 2 Ag: NEGATIVE

## 2023-04-06 LAB — SARS CORONAVIRUS 2 (TAT 6-24 HRS): SARS Coronavirus 2: NEGATIVE

## 2023-04-06 NOTE — Discharge Instructions (Signed)
Your antigen covid test is negative. We will send out a confirmatory covid PCR test. This result will be available within 24 hours. If positive, I would not recommend the anti-viral treatment. Rest, hydration and supportive measures would be appropriate.  Quercetin is an OTC supplement that has helped some people with covid 19. OTC Oscillococcinum helps with fatigue and body aches associated with viral infections.  Salt water gargles, chloraseptic spray and cepacol lozenges may help your scratchy throat.  Monitor for any significant changes in symptoms which may warrant a recheck.

## 2023-04-06 NOTE — ED Triage Notes (Signed)
Fatigue, congestion, scratchy throat x 1 day. States she ate wings and potato wedges on Tuesday and had diarrhea. Concerned for covid

## 2023-04-06 NOTE — ED Provider Notes (Signed)
[facility]    CSN: [id number] Arrival date & time: 04/06/23  2725      History   Chief Complaint Chief Complaint  Patient presents with   Fatigue    HPI [name] is a 52 y.o. female.   Pleasant 52yo female with hx of recent back surgery presents today for complaints of acute fatigue, mild body aches and scratchy throat starting abruptly yesterday. 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 She was out from Feb - July to recoup from her surgery and has only been back to work for about 6 weeks. She does not wear a mask at work, but endorses suspected contact with numerous positive covid cases. She is concerned she may have early symptoms of this. She took several naps yesterday. This morning had a sore/ scratchy throat, no Dysphagia. Had one episode of diarrhea, pt states she normally has constipation associated with her IBS. Denies abdominal pain or nausea. No fever. Does have intermittent cough, but this is not new, and is associated with her recent dx of OSA. She would like to ensure she does not have covid prior to returning to work. She has not tried any OTC medications for her symptoms.      Past Medical History:  Diagnosis Date   Uterine fibroid     There are no problems to display for this patient.   Past Surgical History:  Procedure Laterality Date   BACK SURGERY     FRACTURE SURGERY  2016   ankle    OB History   No obstetric history on file.      Home Medications    Prior to Admission medications   Medication Sig Start Date End Date Taking? Authorizing Provider  fexofenadine (ALLEGRA ALLERGY) 180 MG tablet Take 1 tablet (180 mg total) by mouth daily for 15 days. 01/17/23 02/01/23  Trevor Iha, FNP  linaclotide (LINZESS) 145 MCG CAPS capsule TAKE 1 TABLET BEFORE FIRST MEAL OF THE DAY 90 04/14/17   [provider]  Multiple Vitamins-Minerals (MULTIVITAMIN WITH MINERALS) tablet Take 1 tablet by mouth daily.    [provider]  tranexamic acid (LYSTEDA) 650 MG TABS tablet tranexamic acid 650 mg tablet  TAKE 2 TABLETS BY MOUTH 3 TIMES PER DAY FOR 5 DAYS DURING MENSTRUAL CYCLE 02/10/21   [provider]    Family History Family History  Problem Relation Age of Onset   Thyroid disease Mother    Macular degeneration Mother    Glaucoma Mother    Gout Father    GER disease Father    Deep vein thrombosis Father     Social History Social History   Tobacco Use   Smoking status: Never    Passive exposure: Never   Smokeless tobacco: Never  Vaping Use   Vaping status: Never Used  Substance Use Topics   Alcohol use: Not Currently    Alcohol/week: 2.0 standard drinks of alcohol    Types: 2 Standard drinks or equivalent per week   Drug use: Never     Allergies   Bee venom, Hydrocodone, Bismuth subsalicylate, and Latex   Review of Systems Review of Systems As per HPI  Physical Exam Triage Vital Signs ED Triage Vitals  Encounter Vitals Group     BP 04/06/23 0839 123/80     Systolic BP Percentile --      Diastolic BP Percentile --      Pulse Rate 04/06/23 0839 71     Resp  04/06/23 0839 (!) 71     Temp 04/06/23 0839 98.4 F (36.9 C)     Temp Source 04/06/23 0839 Oral     SpO2 04/06/23 0839 100 %     Weight --      Height --      Head Circumference --      Peak Flow --      Pain Score 04/06/23 0841 0     Pain Loc --      Pain Education --      Exclude from Growth Chart --    No data found.  Updated Vital Signs BP 123/80 (BP Location: Right Arm)   Pulse 71   Temp 98.4 F (36.9 C) (Oral)   Resp 16   SpO2 100%   Visual Acuity Right Eye Distance:   Left Eye Distance:   Bilateral Distance:    Right Eye Near:   Left Eye Near:    Bilateral Near:     Physical Exam Vitals and nursing note reviewed.  Constitutional:      General: She is not in acute distress.    Appearance: Normal appearance. She is well-developed and normal weight. She is not ill-appearing,  toxic-appearing or diaphoretic.  HENT:     Head: Normocephalic and atraumatic.     Right Ear: Tympanic membrane, ear canal and external ear normal. There is no impacted cerumen.     Left Ear: Tympanic membrane, ear canal and external ear normal. There is no impacted cerumen.     Nose: Nose normal. No congestion or rhinorrhea.     Mouth/Throat:     Mouth: Mucous membranes are moist.     Pharynx: Oropharynx is clear. No oropharyngeal exudate or posterior oropharyngeal erythema.  Eyes:     General: No scleral icterus.       Right eye: No discharge.        Left eye: No discharge.     Extraocular Movements: Extraocular movements intact.     Conjunctiva/sclera: Conjunctivae normal.     Pupils: Pupils are equal, round, and reactive to light.  Cardiovascular:     Rate and Rhythm: Normal rate and regular rhythm.     Pulses: Normal pulses.     Heart sounds: Normal heart sounds. No murmur heard. Pulmonary:     Effort: Pulmonary effort is normal. No respiratory distress.     Breath sounds: Normal breath sounds. No stridor. No wheezing, rhonchi or rales.  Abdominal:     Palpations: Abdomen is soft.     Tenderness: There is no abdominal tenderness.  Musculoskeletal:        General: No swelling.     Cervical back: Normal range of motion and neck supple. No rigidity or tenderness.  Lymphadenopathy:     Cervical: No cervical adenopathy.  Skin:    General: Skin is warm and dry.     Capillary Refill: Capillary refill takes less than 2 seconds.     Coloration: Skin is not jaundiced.     Findings: No bruising, erythema or rash.  Neurological:     General: No focal deficit present.     Mental Status: She is alert and oriented to person, place, and time.     Cranial Nerves: No cranial nerve deficit.  Psychiatric:        Mood and Affect: Mood normal.      UC Treatments / Results  Labs (all labs ordered are listed, but only abnormal results are displayed) Labs Reviewed  SARS CORONAVIRUS  2  (TAT 6-24 HRS)  POC SARS CORONAVIRUS 2 AG -  ED    EKG   Radiology No results found.  Procedures Procedures (including critical care time)  Medications Ordered in UC Medications - No data to display  Initial Impression / Assessment and Plan / UC Course  I have reviewed the triage vital signs and the nursing notes.  Pertinent labs & imaging results that were available during my care of the patient were reviewed by me and considered in my medical decision making (see chart for details).     Fatigue - multifactorial. Pt recently back to work, likely contributing to this. Possibly viral in nature given her other sx, however physical exam does not appear consistent with strep or mono. Discussed possibly testing for such in office, pt deferred the need. Will recommend supportive care and monitoring, if persistent possible labs with PCP. Provided pt with a work note x 3 days, however pt requested to return this afternoon working from home which is acceptable. Viral pharyngitis - sx since this morning. Strep/mono testing deferred, likely viral. Supportive measures discussed.  Risk of covid exposure - Ag testing in UC negative. Confirmatory PCR testing obtained per pt request. Pt does not meet anti-viral medication criteria should PCR return positive.    Final Clinical Impressions(s) / UC Diagnoses   Final diagnoses:  Fatigue, unspecified type  Viral pharyngitis  At increased risk of exposure to COVID-19 virus     Discharge Instructions      Your antigen covid test is negative. We will send out a confirmatory covid PCR test. This result will be available within 24 hours. If positive, I would not recommend the anti-viral treatment. Rest, hydration and supportive measures would be appropriate.  Quercetin is an OTC supplement that has helped some people with covid 19. OTC Oscillococcinum helps with fatigue and body aches associated with viral infections.  Salt water gargles,  chloraseptic spray and cepacol lozenges may help your scratchy throat.  Monitor for any significant changes in symptoms which may warrant a recheck.     ED Prescriptions   None    PDMP not reviewed this encounter.   Maretta Bees, [place] 04/06/23 1009

## 2023-06-21 ENCOUNTER — Ambulatory Visit
Admission: EM | Admit: 2023-06-21 | Discharge: 2023-06-21 | Disposition: A | Discharge status: Home / Self Care | Payer: Managed Care, Other (non HMO) | Attending: Family Medicine | Admitting: Family Medicine

## 2023-06-21 DIAGNOSIS — R059 Cough, unspecified: Secondary | ICD-10-CM

## 2023-06-21 DIAGNOSIS — R52 Pain, unspecified: Secondary | ICD-10-CM | POA: Diagnosis not present

## 2023-06-21 LAB — POC INFLUENZA A AND B ANTIGEN (URGENT CARE ONLY)
Influenza A Ag: NEGATIVE
Influenza B Ag: NEGATIVE

## 2023-06-21 LAB — POC SARS CORONAVIRUS 2 AG -  ED: SARS Coronavirus 2 Ag: NEGATIVE

## 2023-06-21 MED ORDER — PREDNISONE 50 MG PO TABS: ORAL_TABLET | ORAL | 0 refills | Status: DC

## 2023-06-21 NOTE — ED Triage Notes (Addendum)
Pt c/o sinus congestion, cough (dry) and bodyaches x 2 days. Hx of sinus infections. Pt would like covid and flu testing today.

## 2023-06-21 NOTE — ED Provider Notes (Signed)
[facility]    CSN: [id number] Arrival date & time: 06/21/23  1649      History   Chief Complaint Chief Complaint  Patient presents with   Nasal Congestion   Cough   Generalized Body Aches    HPI [name] is a 52 y.o. female.   HPI 52 year old female presents with nasal congestion cough, generalized bodyaches for 2 days.  Patient reports history of sinus infections.  PMH significant for uterine fibroid.  Past Medical History:  Diagnosis Date   Uterine fibroid     There are no problems to display for this patient.   Past Surgical History:  Procedure Laterality Date   BACK SURGERY     FRACTURE SURGERY  2016   ankle    OB History   No obstetric history on file.      Home Medications    Prior to Admission medications   Medication Sig Start Date End Date Taking? Authorizing Provider  predniSONE (DELTASONE) 50 MG tablet Take 1 tab p.o. daily for 5 days. 06/21/23  Yes Trevor Iha, FNP  fexofenadine [facility] ALLERGY) 180 MG tablet Take 1 tablet (180 mg total) by mouth daily for 15 days. 01/17/23 02/01/23  Trevor Iha, FNP  linaclotide (LINZESS) 145 MCG CAPS capsule TAKE 1 TABLET BEFORE FIRST MEAL OF THE DAY 90 04/14/17   [provider]  Multiple Vitamins-Minerals (MULTIVITAMIN WITH MINERALS) tablet Take 1 tablet by mouth daily.    [provider]  tranexamic acid (LYSTEDA) 650 MG TABS tablet tranexamic acid 650 mg tablet  TAKE 2 TABLETS BY MOUTH 3 TIMES PER DAY FOR 5 DAYS DURING MENSTRUAL CYCLE 02/10/21   [provider]    Family History Family History  Problem Relation Age of Onset   Thyroid disease Mother    Macular degeneration Mother    Glaucoma Mother    Gout Father    GER disease Father    Deep vein thrombosis Father     Social History Social History   Tobacco Use   Smoking status: Never    Passive exposure: Never   Smokeless tobacco: Never  Vaping Use   Vaping status: Never Used  Substance Use  Topics   Alcohol use: Not Currently    Alcohol/week: 2.0 standard drinks of alcohol    Types: 2 Standard drinks or equivalent per week   Drug use: Never     Allergies   Bee venom, Hydrocodone, Bismuth subsalicylate, and Latex   Review of Systems Review of Systems   Physical Exam Triage Vital Signs ED Triage Vitals  Encounter Vitals Group     BP 06/21/23 1739 (!) 153/79     Systolic BP Percentile --      Diastolic BP Percentile --      Pulse Rate 06/21/23 1739 (!) 55     Resp 06/21/23 1739 17     Temp 06/21/23 1739 98.6 F (37 C)     Temp Source 06/21/23 1739 Oral     SpO2 06/21/23 1739 100 %     Weight --      Height --      Head Circumference --      Peak Flow --      Pain Score 06/21/23 1740 3     Pain Loc --      Pain Education --      Exclude from Growth Chart --    No data found.  Updated Vital Signs BP (!) 153/79 (BP Location: Right  Arm)   Pulse (!) 55   Temp 98.6 F (37 C) (Oral)   Resp 17   SpO2 100%    Physical Exam Vitals and nursing note reviewed.  Constitutional:      Appearance: Normal appearance. She is normal weight.  HENT:     Head: Normocephalic and atraumatic.     Right Ear: Tympanic membrane and external ear normal.     Left Ear: Tympanic membrane and external ear normal.     Mouth/Throat:     Mouth: Mucous membranes are moist.     Pharynx: Oropharynx is clear.  Eyes:     Extraocular Movements: Extraocular movements intact.     Conjunctiva/sclera: Conjunctivae normal.     Pupils: Pupils are equal, round, and reactive to light.  Cardiovascular:     Rate and Rhythm: Normal rate and regular rhythm.     Pulses: Normal pulses.     Heart sounds: Normal heart sounds.  Pulmonary:     Effort: Pulmonary effort is normal.     Breath sounds: Normal breath sounds. No wheezing, rhonchi or rales.  Musculoskeletal:        General: Normal range of motion.     Cervical back: Normal range of motion and neck supple.  Skin:    General: Skin is  warm and dry.  Neurological:     General: No focal deficit present.     Mental Status: She is alert and oriented to person, place, and time. Mental status is at baseline.  Psychiatric:        Mood and Affect: Mood normal.        Behavior: Behavior normal.      UC Treatments / Results  Labs (all labs ordered are listed, but only abnormal results are displayed) Labs Reviewed  POC SARS CORONAVIRUS 2 AG -  ED  POC INFLUENZA A AND B ANTIGEN (URGENT CARE ONLY)    EKG   Radiology No results found.  Procedures Procedures (including critical care time)  Medications Ordered in UC Medications - No data to display  Initial Impression / Assessment and Plan / UC Course  I have reviewed the triage vital signs and the nursing notes.  Pertinent labs & imaging results that were available during my care of the patient were reviewed by me and considered in my medical decision making (see chart for details).     MDM: 1.  Generalized body aches-COVID-19 and influenza both negative this evening.  2.  Cough, unspecified type-Rx'd Prednisone 50 mg tablet daily x 5 days. Advised patient that COVID-19 and influenza were both negative.  Advised patient to take medication as directed with food to completion.   Encouraged to increase daily water intake to 64 ounces per day while taking this medication. Advised if symptoms worsen and/or unresolved please follow-up with PCP or here for further evaluation.  Final Clinical Impressions(s) / UC Diagnoses   Final diagnoses:  Generalized body aches  Cough, unspecified type     Discharge Instructions      Advised patient that COVID-19 and influenza were both negative.  Advised patient to take medication as directed with food to completion.   Encouraged to increase daily water intake to 64 ounces per day while taking this medication.  Advised if symptoms worsen and/or unresolved please follow-up with PCP or here for further evaluation.     ED  Prescriptions     Medication Sig Dispense Auth. Provider   predniSONE (DELTASONE) 50 MG tablet Take 1 tab p.o.  daily for 5 days. 5 tablet Trevor Iha, FNP      PDMP not reviewed this encounter.   Trevor Iha, FNP 06/21/23 1849

## 2023-06-21 NOTE — Discharge Instructions (Addendum)
Advised patient that COVID-19 and influenza were both negative.  Advised patient to take medication as directed with food to completion.   Encouraged to increase daily water intake to 64 ounces per day while taking this medication.  Advised if symptoms worsen and/or unresolved please follow-up with PCP or here for further evaluation.

## 2023-07-19 NOTE — Progress Notes (Signed)
Development  Office Visit Note  Patient: [name]             Date of Birth: [birth year]           MRN: [id number]             PCP: Renaye Rakers, MD Referring: Patricia Nettle, MD Visit Date: 08/02/2023 Occupation: @GUAROCC @  Subjective:  Pain in multiple joints   History of Present Illness: [name] is a 52 y.o. female in consultation per request of Dr. Noel Gerold.  According the patient her symptoms started about 10 to 12 years ago with some knots she noted in her hands.  She states over time her hands have become more stiff and sore.  About 8 years ago she started having pain and discomfort in her knee joints and was seen by Dr. Althea Charon who did x-rays and diagnosed her with osteoarthritis.  She has had cortisone injections followed by Supartz injections.  She states the viscosupplement injections were helpful.  The last injections were about 1 year ago.  She states in April 2023 she fell and injured her lower back.  She was experiencing left thigh pain and lower back pain.  She was evaluated by Dr. Noel Gerold who did x-rays and MRI and diagnosed her with disc disease.  She underwent lumbar spine surgery in February 2027 which relieved her radiculopathy.  She also had physical therapy after the surgery.  She states she resumed walking soon after the surgery and started experiencing some left groin pain at the time she also had further evaluation by Dr. Noel Gerold and was diagnosed with osteoarthritis of her left hip joint.  She states that she has had cortisone injection x 2 to her left hip and also went to physical therapy.  She continues to have some discomfort in her left ankle joint where she had fracture in the past and had surgery.  She also experiences some stiffness in her neck which she relates to tension.  She has had trigger point injections in the past.  There is no history of joint swelling.  She experiences more pain when his cold outside.  She gives history of dry mouth, dry eyes,  episodic rash.  She states she has had 2 or 3 episodes of rash since the back surgery.  She has seen an allergist.  Patient states that she had recent upper respiratory tract infection for which she is taking antibiotics and prednisone.  She still has lingering cough.  There is no family history of autoimmune disease.  She is gravida 0.  There is no history of DVTs.  She works as a Scientist, forensic for [employer].  She used to enjoy hiking, horseback riding, motorcycle, rollerskating and yoga.  Since her back surgery she has been only walking and dancing now.    Activities of Daily Living:  Patient reports morning stiffness for 30 minutes.   Patient Denies nocturnal pain.  Difficulty dressing/grooming: Denies Difficulty climbing stairs: Reports Difficulty getting out of chair: Denies Difficulty using hands for taps, buttons, cutlery, and/or writing: Reports  Review of Systems  Constitutional:  Positive for fatigue.  HENT:  Positive for mouth dryness. Negative for mouth sores.   Eyes:  Positive for dryness.  Respiratory:  Positive for shortness of breath.   Cardiovascular:  Negative for chest pain and palpitations.  Gastrointestinal:  Positive for constipation. Negative for blood in stool and diarrhea.  Endocrine: Positive for increased urination.  Genitourinary:  Positive for involuntary  urination.  Musculoskeletal:  Positive for joint pain, gait problem, joint pain, joint swelling, myalgias, muscle weakness, morning stiffness, muscle tenderness and myalgias.  Skin:  Positive for rash and hair loss. Negative for color change and sensitivity to sunlight.  Allergic/Immunologic: Positive for susceptible to infections.  Neurological:  Negative for dizziness and headaches.  Hematological:  Negative for swollen glands.  Psychiatric/Behavioral:  Positive for sleep disturbance. Negative for depressed mood. The patient is nervous/anxious.     PMFS History:  Patient Active  Problem List   Diagnosis Date Noted   Seasonal allergies 08/02/2023   Gluten intolerance 08/02/2023   Irritable bowel syndrome with constipation 08/02/2023   Degeneration of intervertebral disc of lumbar region without discogenic back pain or lower extremity pain 08/02/2023   Closed displaced fracture of lateral malleolus of left fibula 08/02/2023   Primary osteoarthritis of left hip 08/02/2023    Past Medical History:  Diagnosis Date   Uterine fibroid     Family History  Problem Relation Age of Onset   Thyroid disease Mother    Macular degeneration Mother    Glaucoma Mother    Gout Father    GER disease Father    Deep vein thrombosis Father    Past Surgical History:  Procedure Laterality Date   ANKLE FRACTURE SURGERY Left 2016   BACK SURGERY     FRACTURE SURGERY  2016   ankle   NEUROMA SURGERY Left    UTERINE FIBROID SURGERY     Social History   Social History Narrative   Not on file    There is no immunization history on file for this patient.   Objective: Vital Signs: BP 126/83 (BP Location: Right Arm, Patient Position: Sitting, Cuff Size: Normal)   Pulse (!) 50   Resp 14   Ht 5' 0.25" (1.53 m)   Wt 142 lb (64.4 kg)   BMI 27.50 kg/m    Physical Exam Vitals and nursing note reviewed.  Constitutional:      Appearance: She is well-developed.  HENT:     Head: Normocephalic and atraumatic.  Eyes:     Conjunctiva/sclera: Conjunctivae normal.  Cardiovascular:     Rate and Rhythm: Normal rate and regular rhythm.     Heart sounds: Normal heart sounds.  Pulmonary:     Effort: Pulmonary effort is normal.     Breath sounds: Normal breath sounds.  Abdominal:     General: Bowel sounds are normal.     Palpations: Abdomen is soft.  Musculoskeletal:     Cervical back: Normal range of motion.  Lymphadenopathy:     Cervical: No cervical adenopathy.  Skin:    General: Skin is warm and dry.     Capillary Refill: Capillary refill takes less than 2 seconds.   Neurological:     Mental Status: She is alert and oriented to person, place, and time.  Psychiatric:        Behavior: Behavior normal.      Musculoskeletal Exam: Cervical, thoracic and lumbar spine were in good range of motion without discomfort.  She was able to reach her toes without any difficulty.  Shoulder joints, elbow joints, wrist joints, MCPs PIPs and DIPs were in good range of motion.  She had bilateral DIP thickening with no synovitis.  Hip joints and knee joints were in good range of motion.  No warmth swelling or effusion was noted.  She has some discomfort range of motion of her left hip joint.  She had good range  of motion of bilateral ankles and MTPs.  She had a surgical scar on her left ankle and also on her left foot from previous Morton's neuroma surgery.  CDAI Exam: CDAI Score: -- Patient Global: --; Provider Global: -- Swollen: --; Tender: -- Joint Exam 08/02/2023   No joint exam has been documented for this visit   There is currently no information documented on the homunculus. Go to the Rheumatology activity and complete the homunculus joint exam.  Investigation: No additional findings.  Imaging: XR Hand 2 View Left Result Date: 08/02/2023 CMC, PIP and DIP narrowing was noted.  No MCP, intercarpal or radiocarpal joint space narrowing was noted. 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  XR Hand 2 View Right Result Date: 08/02/2023 CMC, PIP and DIP narrowing was noted.  No MCP, intercarpal or radiocarpal joint space narrowing was noted.  Cortical thickening was noted at the base of the third proximal phalanx.  These findings are suspicious of enchondroma. Impression: The findings are suggestive of osteoarthritis.  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  Procedures:  No procedures performed Allergies: Bee venom, Hydrocodone, Bismuth subsalicylate, and Latex   Assessment / Plan:     Visit Diagnoses: Polyarthralgia -patient complains of pain and discomfort in multiple joints over the last 10 to 12 years.  She states the pain is gradually getting worse.  She denies any history of joint swelling.  I will obtain labs today.  Plan: Sedimentation rate, Rheumatoid factor, Cyclic citrul peptide antibody, IgG, ANA, Uric acid  Pain in both hands -she started noticing knots over her hands about 10 years ago.  She has a stiffness and discomfort in her hands .  She has not noticed  typical features of Raynaud's phenomenon.  She states she has more discomfort in the colder temperatures.  Use of arthritis gloves and hand muscle strengthening exercises were given.  No synovitis was noted on the examination today.  She had bilateral DIP thickening more prominent in her right hand.  She is left-handed.  Plan: XR Hand 2 View Right, XR Hand 2 View Left.  X-rays of bilateral hands were suggestive of osteoarthritis.  Possible enchondroma was noted at the base of the left third third proximal phalanx.  X-ray findings were reviewed with the patient.  Patient plans to see Dr. Janee Morn at [facility].  She has seen Dr. Janee Morn in the past.  Primary osteoarthritis of left hip -patient states she started having left hip pain this year.  In June 2024 x-rays done by Dr.Cohen showed femoral acetabular joint narrowing medially with bilateral pincer deformities she had cortisone injection x 2 per patient.  She also had physical therapy which improved her discomfort.  Primary osteoarthritis of both knees-patient has been experiencing discomfort in her knee joints for the last several years.  She was under care  of Dr. Althea Charon and had x-rays.  She was diagnosed with osteoarthritis.  She has had cortisone injection and Supartz injections in the past.  Her last series of [place]os was last year.  She had good relief from the injections.  Detailed counseling running osteoarthritis was provided.  A handout on osteoarthritis was given.  Closed displaced fracture of lateral malleolus  of left fibula, sequela-she continues to have intermittent discomfort in her left ankle.  She also had surgical scar on the left foot for Morton's neuroma surgery.  Degeneration of intervertebral disc of lumbar region without discogenic back pain or lower extremity pain -patient states the radiculopathy resolved after the lumbar spine surgery.  Under care of Dr. Noel Gerold.  Status post L3-L4 laminectomy, microdiscectomy on October 11, 2022.  L4-L5 discectomy 2010.  Irritable bowel syndrome with constipation - Followed by Dr. Loreta Ave  Gluten intolerance  Seasonal allergies  Urticaria-she had 2 or 3 episodes of urticaria since her lumbar spine surgery.  Patient states she was seen by an allergist.  Eloisa Northern under a lot of stress due to her work situation.  Orders: Orders Placed This Encounter  Procedures   XR Hand 2 View Right   XR Hand 2 View Left   Sedimentation rate   Rheumatoid factor   Cyclic citrul peptide antibody, IgG   ANA   Uric acid   Ambulatory referral to Hand Surgery   No orders of the defined types were placed in this encounter.   Face-to-face time spent with patient was 50 minutes. Greater than 50% of time was spent in counseling and coordination of care.  Follow-Up Instructions: Return for polyarthralgia.   Pollyann Savoy, MD  Note - This record has been created using Animal nutritionist.  Chart creation errors have been sought, but may not always  have been located. Such creation errors do not reflect on  the standard of medical care.

## 2023-07-28 ENCOUNTER — Ambulatory Visit
Admission: EM | Admit: 2023-07-28 | Discharge: 2023-07-28 | Disposition: A | Discharge status: Home / Self Care | Payer: Managed Care, Other (non HMO) | Attending: Family Medicine | Admitting: Family Medicine

## 2023-07-28 ENCOUNTER — Other Ambulatory Visit: Payer: Self-pay

## 2023-07-28 ENCOUNTER — Encounter: Payer: Self-pay | Admitting: Family Medicine

## 2023-07-28 DIAGNOSIS — R059 Cough, unspecified: Secondary | ICD-10-CM | POA: Diagnosis not present

## 2023-07-28 DIAGNOSIS — R062 Wheezing: Secondary | ICD-10-CM

## 2023-07-28 DIAGNOSIS — J069 Acute upper respiratory infection, unspecified: Secondary | ICD-10-CM | POA: Diagnosis not present

## 2023-07-28 MED ORDER — PREDNISONE 20 MG PO TABS: ORAL_TABLET | ORAL | 0 refills | Status: DC

## 2023-07-28 MED ORDER — PROMETHAZINE-DM 6.25-15 MG/5ML PO SYRP: 5.0000 mL | ORAL_SOLUTION | Freq: Two times a day (BID) | ORAL | 0 refills | Status: DC | PRN

## 2023-07-28 MED ORDER — BENZONATATE 200 MG PO CAPS: 200.0000 mg | ORAL_CAPSULE | Freq: Three times a day (TID) | ORAL | 0 refills | Status: AC | PRN

## 2023-07-28 MED ORDER — DOXYCYCLINE HYCLATE 100 MG PO CAPS: 100.0000 mg | ORAL_CAPSULE | Freq: Two times a day (BID) | ORAL | 0 refills | Status: AC

## 2023-07-28 MED ORDER — ALBUTEROL SULFATE HFA 108 (90 BASE) MCG/ACT IN AERS: 1.0000 | INHALATION_SPRAY | Freq: Four times a day (QID) | RESPIRATORY_TRACT | 0 refills | Status: AC | PRN

## 2023-07-28 NOTE — ED Provider Notes (Signed)
[facility]    CSN: [id number] Arrival date & time: 07/28/23  1848      History   Chief Complaint No chief complaint on file.   HPI [name] is a 52 y.o. female.   HPI Very pleasant 52 year old female presents with dry cough for over 1 month per patient.  Additionally, patient reports intermittent wheezing more predominant at night prior to sleep which disrupts her sleep.  PMH significant for uterine fibroid and previous back surgery.  Past Medical History:  Diagnosis Date   Uterine fibroid     There are no active problems to display for this patient.   Past Surgical History:  Procedure Laterality Date   BACK SURGERY     FRACTURE SURGERY  2016   ankle    OB History   No obstetric history on file.      Home Medications    Prior to Admission medications   Medication Sig Start Date End Date Taking? Authorizing Provider  albuterol (VENTOLIN HFA) 108 (90 Base) MCG/ACT inhaler Inhale 1-2 puffs into the lungs every 6 (six) hours as needed for wheezing or shortness of breath. 07/28/23 08/27/23 Yes Trevor Iha, FNP  benzonatate (TESSALON) 200 MG capsule Take 1 capsule (200 mg total) by mouth 3 (three) times daily as needed for up to 7 days. 07/28/23 08/04/23 Yes Trevor Iha, FNP  doxycycline (VIBRAMYCIN) 100 MG capsule Take 1 capsule (100 mg total) by mouth 2 (two) times daily for 7 days. 07/28/23 08/04/23 Yes Trevor Iha, FNP  predniSONE (DELTASONE) 20 MG tablet Take 3 tabs PO daily x 5 days. 07/28/23  Yes Trevor Iha, FNP  promethazine-dextromethorphan (PROMETHAZINE-DM) 6.25-15 MG/5ML syrup Take 5 mLs by mouth 2 (two) times daily as needed for cough. 07/28/23  Yes Trevor Iha, FNP  linaclotide (LINZESS) 145 MCG CAPS capsule TAKE 1 TABLET BEFORE FIRST MEAL OF THE DAY 90 04/14/17   [provider]  Multiple Vitamins-Minerals (MULTIVITAMIN WITH MINERALS) tablet Take 1 tablet by mouth daily.    [provider]  tranexamic  acid (LYSTEDA) 650 MG TABS tablet tranexamic acid 650 mg tablet  TAKE 2 TABLETS BY MOUTH 3 TIMES PER DAY FOR 5 DAYS DURING MENSTRUAL CYCLE 02/10/21   [provider]    Family History Family History  Problem Relation Age of Onset   Thyroid disease Mother    Macular degeneration Mother    Glaucoma Mother    Gout Father    GER disease Father    Deep vein thrombosis Father     Social History Social History   Tobacco Use   Smoking status: Never    Passive exposure: Never   Smokeless tobacco: Never  Vaping Use   Vaping status: Never Used  Substance Use Topics   Alcohol use: Not Currently    Alcohol/week: 2.0 standard drinks of alcohol    Types: 2 Standard drinks or equivalent per week   Drug use: Never     Allergies   Bee venom, Hydrocodone, Bismuth subsalicylate, and Latex   Review of Systems Review of Systems  Respiratory:  Positive for cough and wheezing.   All other systems reviewed and are negative.    Physical Exam Triage Vital Signs ED Triage Vitals  Encounter Vitals Group     BP      Systolic BP Percentile      Diastolic BP Percentile      Pulse      Resp      Temp  Temp src      SpO2      Weight      Height      Head Circumference      Peak Flow      Pain Score      Pain Loc      Pain Education      Exclude from Growth Chart    No data found.  Updated Vital Signs BP 132/87   Pulse 63   Temp 98.3 F (36.8 C)   Resp 16   SpO2 99%    Physical Exam Vitals and nursing note reviewed.  Constitutional:      Appearance: Normal appearance. She is normal weight. She is ill-appearing.  HENT:     Head: Normocephalic and atraumatic.     Right Ear: Tympanic membrane, ear canal and external ear normal.     Left Ear: Tympanic membrane, ear canal and external ear normal.     Mouth/Throat:     Mouth: Mucous membranes are moist.     Pharynx: Oropharynx is clear.  Eyes:     Extraocular Movements: Extraocular movements intact.      Conjunctiva/sclera: Conjunctivae normal.     Pupils: Pupils are equal, round, and reactive to light.  Cardiovascular:     Rate and Rhythm: Normal rate and regular rhythm.     Pulses: Normal pulses.     Heart sounds: Normal heart sounds. No murmur heard. Pulmonary:     Effort: Pulmonary effort is normal.     Breath sounds: Normal breath sounds. No wheezing, rhonchi or rales.     Comments: Infrequent nonproductive cough noted on exam Musculoskeletal:        General: Normal range of motion.     Cervical back: Normal range of motion and neck supple.  Skin:    General: Skin is warm and dry.  Neurological:     General: No focal deficit present.     Mental Status: She is alert and oriented to person, place, and time. Mental status is at baseline.  Psychiatric:        Mood and Affect: Mood normal.        Behavior: Behavior normal.      UC Treatments / Results  Labs (all labs ordered are listed, but only abnormal results are displayed) Labs Reviewed - No data to display  EKG   Radiology No results found.  Procedures Procedures (including critical care time)  Medications Ordered in UC Medications - No data to display  Initial Impression / Assessment and Plan / UC Course  I have reviewed the triage vital signs and the nursing notes.  Pertinent labs & imaging results that were available during my care of the patient were reviewed by me and considered in my medical decision making (see chart for details).     MDM: 1.  Acute upper respiratory infection-Rx'd doxycycline 100 mg capsule: Take 1 capsule twice daily x 7 days; 2.  Cough, unspecified type-Rx'd prednisone 20 mg tablet: Take 3 tabs p.o. daily x 5 days, Tessalon 200 mg capsules: Take 1 capsule 3 times daily, as needed for cough, Rx'd Promethazine DM 6.25-15 mg/5 mL syrup: Take 5 mL twice daily, as needed for cough; 3.  Wheezing-Rx albuterol inhaler (Ventolin HFA) 108/90 base MCG/ACT inhaler: Inhale 1 to 2 puffs into lungs  every 6 hours as needed for wheezing. Advised patient to take medications as directed with food to completion.  Advised patient to take prednisone with first dose of doxycycline  for the next 5 of 7 days.  Advised may use Tessalon capsules daily or as needed for cough.  Advised may use Promethazine DM for cough at night prior to sleep due to sedative effects.  Encouraged to increase daily water intake to 64 ounces per day while taking this medications.  Advised if symptoms worsen and/or unresolved please follow-up PCP or here for further evaluation.  Patient discharged home, hemodynamically stable.  Work note provided to patient prior to discharge per request. Final Clinical Impressions(s) / UC Diagnoses   Final diagnoses:  Cough, unspecified type  Acute upper respiratory infection  Wheezing     Discharge Instructions      Advised patient to take medications as directed with food to completion.  Advised patient to take prednisone with first dose of doxycycline for the next 5 of 7 days.  Advised may use Tessalon capsules daily or as needed for cough.  Advised may use Promethazine DM for cough at night prior to sleep due to sedative effects.  Encouraged to increase daily water intake to 64 ounces per day while taking this medications.  Advised if symptoms worsen and/or unresolved please follow-up PCP or here for further evaluation.     ED Prescriptions     Medication Sig Dispense Auth. Provider   doxycycline (VIBRAMYCIN) 100 MG capsule Take 1 capsule (100 mg total) by mouth 2 (two) times daily for 7 days. 14 capsule Trevor Iha, FNP   predniSONE (DELTASONE) 20 MG tablet Take 3 tabs PO daily x 5 days. 15 tablet Trevor Iha, FNP   benzonatate (TESSALON) 200 MG capsule Take 1 capsule (200 mg total) by mouth 3 (three) times daily as needed for up to 7 days. 40 capsule Trevor Iha, FNP   promethazine-dextromethorphan (PROMETHAZINE-DM) 6.25-15 MG/5ML syrup Take 5 mLs by mouth 2 (two) times  daily as needed for cough. 118 mL Trevor Iha, FNP   albuterol (VENTOLIN HFA) 108 (90 Base) MCG/ACT inhaler Inhale 1-2 puffs into the lungs every 6 (six) hours as needed for wheezing or shortness of breath. 1 each Trevor Iha, FNP      PDMP not reviewed this encounter.   Trevor Iha, FNP 07/28/23 1925

## 2023-07-28 NOTE — ED Triage Notes (Signed)
C/o cough over a month, dry. Has been taking alkaseltzer plus, dayquil, cough drops.

## 2023-07-28 NOTE — Discharge Instructions (Addendum)
Advised patient to take medications as directed with food to completion.  Advised patient to take prednisone with first dose of doxycycline for the next 5 of 7 days.  Advised may use Tessalon capsules daily or as needed for cough.  Advised may use Promethazine DM for cough at night prior to sleep due to sedative effects.  Encouraged to increase daily water intake to 64 ounces per day while taking this medications.  Advised if symptoms worsen and/or unresolved please follow-up PCP or here for further evaluation.

## 2023-08-02 ENCOUNTER — Encounter: Payer: Self-pay | Admitting: *Deleted

## 2023-08-02 ENCOUNTER — Encounter: Payer: Self-pay | Admitting: Rheumatology

## 2023-08-02 ENCOUNTER — Ambulatory Visit: Payer: Managed Care, Other (non HMO)

## 2023-08-02 ENCOUNTER — Ambulatory Visit: Payer: Managed Care, Other (non HMO) | Attending: Rheumatology | Admitting: Rheumatology

## 2023-08-02 ENCOUNTER — Ambulatory Visit (INDEPENDENT_AMBULATORY_CARE_PROVIDER_SITE_OTHER): Payer: Managed Care, Other (non HMO)

## 2023-08-02 ENCOUNTER — Telehealth: Payer: Self-pay | Admitting: Rheumatology

## 2023-08-02 VITALS — BP 126/83 | HR 50 | Resp 14 | Ht 60.25 in | Wt 142.0 lb

## 2023-08-02 DIAGNOSIS — M79641 Pain in right hand: Secondary | ICD-10-CM | POA: Diagnosis not present

## 2023-08-02 DIAGNOSIS — S8262XS Displaced fracture of lateral malleolus of left fibula, sequela: Secondary | ICD-10-CM

## 2023-08-02 DIAGNOSIS — K9041 Non-celiac gluten sensitivity: Secondary | ICD-10-CM | POA: Insufficient documentation

## 2023-08-02 DIAGNOSIS — L509 Urticaria, unspecified: Secondary | ICD-10-CM

## 2023-08-02 DIAGNOSIS — M51369 Other intervertebral disc degeneration, lumbar region without mention of lumbar back pain or lower extremity pain: Secondary | ICD-10-CM | POA: Insufficient documentation

## 2023-08-02 DIAGNOSIS — M2550 Pain in unspecified joint: Secondary | ICD-10-CM

## 2023-08-02 DIAGNOSIS — M1612 Unilateral primary osteoarthritis, left hip: Secondary | ICD-10-CM | POA: Insufficient documentation

## 2023-08-02 DIAGNOSIS — M51362 Other intervertebral disc degeneration, lumbar region with discogenic back pain and lower extremity pain: Secondary | ICD-10-CM

## 2023-08-02 DIAGNOSIS — J302 Other seasonal allergic rhinitis: Secondary | ICD-10-CM | POA: Insufficient documentation

## 2023-08-02 DIAGNOSIS — M79642 Pain in left hand: Secondary | ICD-10-CM

## 2023-08-02 DIAGNOSIS — M170 Bilateral primary osteoarthritis of knee: Secondary | ICD-10-CM

## 2023-08-02 DIAGNOSIS — M25572 Pain in left ankle and joints of left foot: Secondary | ICD-10-CM

## 2023-08-02 DIAGNOSIS — K581 Irritable bowel syndrome with constipation: Secondary | ICD-10-CM | POA: Insufficient documentation

## 2023-08-02 DIAGNOSIS — S8262XA Displaced fracture of lateral malleolus of left fibula, initial encounter for closed fracture: Secondary | ICD-10-CM | POA: Insufficient documentation

## 2023-08-02 DIAGNOSIS — F439 Reaction to severe stress, unspecified: Secondary | ICD-10-CM

## 2023-08-02 DIAGNOSIS — G4733 Obstructive sleep apnea (adult) (pediatric): Secondary | ICD-10-CM

## 2023-08-02 DIAGNOSIS — R011 Cardiac murmur, unspecified: Secondary | ICD-10-CM

## 2023-08-02 NOTE — Telephone Encounter (Signed)
Letter sent to patient via mychart.  

## 2023-08-02 NOTE — Patient Instructions (Addendum)
Hand Exercises Hand exercises can be helpful for almost anyone. They can strengthen your hands and improve flexibility and movement. The exercises can also increase blood flow to the hands. These results can make your work and daily tasks easier for you. Hand exercises can be especially helpful for people who have joint pain from arthritis or nerve damage from using their hands over and over. These exercises can also help people who injure a hand. Exercises Most of these hand exercises are gentle stretching and motion exercises. It is usually safe to do them often throughout the day. Warming up your hands before exercise may help reduce stiffness. You can do this with gentle massage or by placing your hands in warm water for 10-15 minutes. It is normal to feel some stretching, pulling, tightness, or mild discomfort when you begin new exercises. In time, this will improve. Remember to always be careful and stop right away if you feel sudden, very bad pain or your pain gets worse. You want to get better and be safe. Ask your health care provider which exercises are safe for you. Do exercises exactly as told by your provider and adjust them as told. Do not begin these exercises until told by your provider. Knuckle bend or "claw" fist  Stand or sit with your arm, hand, and all five fingers pointed straight up. Make sure to keep your wrist straight. Gently bend your fingers down toward your palm until the tips of your fingers are touching your palm. Keep your big knuckle straight and only bend the small knuckles in your fingers. Hold this position for 10 seconds. Straighten your fingers back to your starting position. Repeat this exercise 5-10 times with each hand. Full finger fist  Stand or sit with your arm, hand, and all five fingers pointed straight up. Make sure to keep your wrist straight. Gently bend your fingers into your palm until the tips of your fingers are touching the middle of your  palm. Hold this position for 10 seconds. Extend your fingers back to your starting position, stretching every joint fully. Repeat this exercise 5-10 times with each hand. Straight fist  Stand or sit with your arm, hand, and all five fingers pointed straight up. Make sure to keep your wrist straight. Gently bend your fingers at the big knuckle, where your fingers meet your hand, and at the middle knuckle. Keep the knuckle at the tips of your fingers straight and try to touch the bottom of your palm. Hold this position for 10 seconds. Extend your fingers back to your starting position, stretching every joint fully. Repeat this exercise 5-10 times with each hand. Tabletop  Stand or sit with your arm, hand, and all five fingers pointed straight up. Make sure to keep your wrist straight. Gently bend your fingers at the big knuckle, where your fingers meet your hand, as far down as you can. Keep the small knuckles in your fingers straight. Think of forming a tabletop with your fingers. Hold this position for 10 seconds. Extend your fingers back to your starting position, stretching every joint fully. Repeat this exercise 5-10 times with each hand. Finger spread  Place your hand flat on a table with your palm facing down. Make sure your wrist stays straight. Spread your fingers and thumb apart from each other as far as you can until you feel a gentle stretch. Hold this position for 10 seconds. Bring your fingers and thumb tight together again. Hold this position for 10 seconds. Repeat   this exercise 5-10 times with each hand. Making circles  Stand or sit with your arm, hand, and all five fingers pointed straight up. Make sure to keep your wrist straight. Make a circle by touching the tip of your thumb to the tip of your index finger. Hold for 10 seconds. Then open your hand wide. Repeat this motion with your thumb and each of your fingers. Repeat this exercise 5-10 times with each hand. Thumb  motion  Sit with your forearm resting on a table and your wrist straight. Your thumb should be facing up toward the ceiling. Keep your fingers relaxed as you move your thumb. Lift your thumb up as high as you can toward the ceiling. Hold for 10 seconds. Bend your thumb across your palm as far as you can, reaching the tip of your thumb for the small finger (pinkie) side of your palm. Hold for 10 seconds. Repeat this exercise 5-10 times with each hand. Grip strengthening  Hold a stress ball or other soft ball in the middle of your hand. Slowly increase the pressure, squeezing the ball as much as you can without causing pain. Think of bringing the tips of your fingers into the middle of your palm. All of your finger joints should bend when doing this exercise. Hold your squeeze for 10 seconds, then relax. Repeat this exercise 5-10 times with each hand. Contact a health care provider if: Your hand pain or discomfort gets much worse when you do an exercise. Your hand pain or discomfort does not improve within 2 hours after you exercise. If you have either of these problems, stop doing these exercises right away. Do not do them again unless your provider says that you can. Get help right away if: You develop sudden, severe hand pain or swelling. If this happens, stop doing these exercises right away. Do not do them again unless your provider says that you can. This information is not intended to replace advice given to you by your health care provider. Make sure you discuss any questions you have with your health care provider. Document Revised: 08/16/2022 Document Reviewed: 08/16/2022 Elsevier Patient Education  2024 [employer]  Osteoarthritis  Osteoarthritis is a type of arthritis. It refers to joint pain or joint disease. Osteoarthritis affects tissue that covers the ends of bones in joints (cartilage). Cartilage acts as a cushion between the bones and helps them move smoothly.  Osteoarthritis occurs when cartilage in the joints gets worn down. Osteoarthritis is sometimes called "wear and tear" arthritis. Osteoarthritis is the most common form of arthritis. It often occurs in older people. It is a condition that gets worse over time. The joints most often affected by this condition are in the fingers, toes, hips, knees, and spine, including the neck and lower back. What are the causes? This condition is caused by the wearing down of cartilage that covers the ends of bones. What increases the risk? The following factors may make you more likely to develop this condition: Being age 47 or older. Obesity. Overuse of joints. Past injury of a joint. Past surgery on a joint. Family history of osteoarthritis. What are the signs or symptoms? The main symptoms of this condition are pain, swelling, and stiffness in the joint. Other symptoms may include: An enlarged joint. More pain and further damage caused by small pieces of bone or cartilage that break off and float inside of the joint. Small deposits of bone (osteophytes) that grow on the edges of the joint. A  grating or scraping feeling inside the joint when you move it. Popping or creaking sounds when you move. Difficulty walking or exercising. An inability to grip items, twist your hand, or control the movements of your hands and fingers. How is this diagnosed? This condition may be diagnosed based on: Your medical history. A physical exam. Your symptoms. X-rays of the affected joints. Blood tests to rule out other types of arthritis. How is this treated? There is no cure for this condition, but treatment can help control pain and improve joint function. Treatment may include a combination of therapies, such as: Pain relief techniques, such as: Applying heat and cold to the joint. Massage. A form of talk therapy called cognitive behavioral therapy (CBT). This therapy helps you set goals and follow up on the  changes that you make. Medicines for pain and inflammation. The medicines can be taken by mouth or applied to the skin. They include: NSAIDs, such as ibuprofen. Prescription medicines. Strong anti-inflammatory medicines (corticosteroids). Certain nutritional supplements. A prescribed exercise program. You may work with a physical therapist. Assistive devices, such as a brace, wrap, splint, specialized glove, or cane. A weight control plan. Surgery, such as: An osteotomy. This is done to reposition the bones and relieve pain or to remove loose pieces of bone and cartilage. Joint replacement surgery. You may need this surgery if you have advanced osteoarthritis. Follow these instructions at home: Activity Rest your affected joints as told by your health care provider. Exercise as told by your provider. The provider may recommend specific types of exercise, such as: Strengthening exercises. These are done to strengthen the muscles that support joints affected by arthritis. Aerobic activities. These are exercises, such as brisk walking or water aerobics, that increase your heart rate. Range-of-motion activities. These help your joints move more easily. Balance and agility exercises. Managing pain, stiffness, and swelling     If told, apply heat to the affected area as often as told by your provider. Use the heat source that your provider recommends, such as a moist heat pack or a heating pad. If you have a removable assistive device, remove it as told by your provider. Place a towel between your skin and the heat source. If your provider tells you to keep the assistive device on while you apply heat, place a towel between the assistive device and the heat source. Leave the heat on for 20-30 minutes. If told, put ice on the affected area. If you have a removable assistive device, remove it as told by your provider. Put ice in a plastic bag. Place a towel between your skin and the bag. If  your provider tells you to keep the assistive device on during icing, place a towel between the assistive device and the bag. Leave the ice on for 20 minutes, 2-3 times a day. If your skin turns bright red, remove the ice or heat right away to prevent skin damage. The risk of damage is higher if you cannot feel pain, heat, or cold. Move your fingers or toes often to reduce stiffness and swelling. Raise (elevate) the affected area above the level of your heart while you are sitting or lying down. General instructions Take over-the-counter and prescription medicines only as told by your provider. Maintain a healthy weight. Follow instructions from your provider for weight control. Do not use any products that contain nicotine or tobacco. These products include cigarettes, chewing tobacco, and vaping devices, such as e-cigarettes. If you need help quitting, ask your  provider. Use assistive devices as told by your provider. Where to find more information [employer] of Arthritis and Musculoskeletal and Skin Diseases: niams.http://www.myers.net/ [employer] on Aging: BaseRingTones.pl American College of Rheumatology: rheumatology.org Contact a health care provider if: You have redness, swelling, or a feeling of warmth in a joint that gets worse. You have a fever along with joint or muscle aches. You develop a rash. You have trouble doing your normal activities. You have pain that gets worse and is not relieved by pain medicine. This information is not intended to replace advice given to you by your health care provider. Make sure you discuss any questions you have with your health care provider. Document Revised: 03/31/2022 Document Reviewed: 03/31/2022 Elsevier Patient Education  2024 [employer].

## 2023-08-02 NOTE — Telephone Encounter (Signed)
Pt would like a Doctors note for today's visit 08/02/23. Pt said if it could be sent in Mychart that would be great if not she said we could mail it to her.

## 2023-08-04 LAB — ANTI-NUCLEAR AB-TITER (ANA TITER)
ANA TITER: 1:40 {titer} — ABNORMAL HIGH
ANA Titer 1: 1:40 {titer} — ABNORMAL HIGH

## 2023-08-04 LAB — SEDIMENTATION RATE: Sed Rate: 2 mm/h (ref 0–30)

## 2023-08-04 LAB — CYCLIC CITRUL PEPTIDE ANTIBODY, IGG: Cyclic Citrullin Peptide Ab: <16 U

## 2023-08-04 LAB — URIC ACID: Uric Acid, Serum: 3.4 mg/dL (ref 2.5–7.0)

## 2023-08-04 LAB — ANA: Anti Nuclear Antibody (ANA): POSITIVE — AB

## 2023-08-04 LAB — RHEUMATOID FACTOR: Rheumatoid fact SerPl-aCnc: <10 [IU]/mL (ref <14)

## 2023-08-06 NOTE — Progress Notes (Signed)
ANA is low titer positive, RF negative, sed rate normal, anti-CCP negative, uric acid normal.  I will discuss results at the follow-up visit.

## 2023-08-15 ENCOUNTER — Other Ambulatory Visit: Payer: Self-pay | Admitting: Obstetrics and Gynecology

## 2023-08-15 DIAGNOSIS — Z1231 Encounter for screening mammogram for malignant neoplasm of breast: Secondary | ICD-10-CM

## 2023-08-17 ENCOUNTER — Telehealth: Payer: Self-pay | Admitting: *Deleted

## 2023-08-17 NOTE — Telephone Encounter (Signed)
 Patient contacted the office and left message stating that someone spoke with her husband regarding lab results.   Attempted to contact the patient and left message to advise patient we reach out to her or her husband regarding any lab results. Advised she may want to reach out to other medical office they may go to.

## 2023-08-24 ENCOUNTER — Telehealth: Payer: Self-pay | Admitting: *Deleted

## 2023-08-24 NOTE — Telephone Encounter (Signed)
 Patient contacted the office stating we made her a disc with her x-rays on it. Patient states she is at Dr. Oswald office where we referred her and when she pulled the disc out to give to the doctor she realized it is now cracked. Patient would like to be able to come by today and pick up another disc with her x-rays on them.

## 2023-08-24 NOTE — Telephone Encounter (Signed)
 I called patient, CD at front desk for patient to pick up at her convenience.

## 2023-08-30 ENCOUNTER — Ambulatory Visit: Payer: Managed Care, Other (non HMO) | Admitting: Rheumatology

## 2023-09-01 NOTE — Progress Notes (Deleted)
 Office Visit Note  Patient: [name]             Date of Birth: 1971-03-28           MRN: [id number]             PCP: Renaye Rakers, MD Referring: Renaye Rakers, MD Visit Date: 09/15/2023 Occupation: @GUAROCC @  Subjective:  No chief complaint on file.   History of Present Illness: [name] is a 53 y.o. female ***     Activities of Daily Living:  Patient reports morning stiffness for *** {minute/hour:19697}.   Patient {ACTIONS;DENIES/REPORTS:21021675::"Denies"} nocturnal pain.  Difficulty dressing/grooming: {ACTIONS;DENIES/REPORTS:21021675::"Denies"} Difficulty climbing stairs: {ACTIONS;DENIES/REPORTS:21021675::"Denies"} Difficulty getting out of chair: {ACTIONS;DENIES/REPORTS:21021675::"Denies"} Difficulty using hands for taps, buttons, cutlery, and/or writing: {ACTIONS;DENIES/REPORTS:21021675::"Denies"}  No Rheumatology ROS completed.   PMFS History:  Patient Active Problem List   Diagnosis Date Noted   Seasonal allergies 08/02/2023   Gluten intolerance 08/02/2023   Irritable bowel syndrome with constipation 08/02/2023   Degeneration of intervertebral disc of lumbar region without discogenic back pain or lower extremity pain 08/02/2023   Closed displaced fracture of lateral malleolus of left fibula 08/02/2023   Primary osteoarthritis of left hip 08/02/2023    Past Medical History:  Diagnosis Date   Uterine fibroid     Family History  Problem Relation Age of Onset   Thyroid disease Mother    Macular degeneration Mother    Glaucoma Mother    Gout Father    GER disease Father    Deep vein thrombosis Father    Past Surgical History:  Procedure Laterality Date   ANKLE FRACTURE SURGERY Left 2016   BACK SURGERY     FRACTURE SURGERY  2016   ankle   NEUROMA SURGERY Left    UTERINE FIBROID SURGERY     Social History   Social History Narrative   Not on file    There is no immunization history on file for this patient.   Objective: Vital Signs:  There were no vitals taken for this visit.   Physical Exam   Musculoskeletal Exam: ***  CDAI Exam: CDAI Score: -- Patient Global: --; Provider Global: -- Swollen: --; Tender: -- Joint Exam 09/15/2023   No joint exam has been documented for this visit   There is currently no information documented on the homunculus. Go to the Rheumatology activity and complete the homunculus joint exam.  Investigation: No additional findings.  Imaging: XR Hand 2 View Left Result Date: 08/02/2023 CMC, PIP and DIP narrowing was noted.  No MCP, intercarpal or radiocarpal joint space narrowing was noted. 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  XR Hand 2 View Right Result Date: 08/02/2023 CMC, PIP and DIP narrowing was noted.  No MCP, intercarpal or radiocarpal joint space narrowing was noted.  Cortical thickening was noted at the base of the third proximal phalanx.  These findings are suspicious of enchondroma. Impression: The findings are suggestive of osteoarthritis.  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  Procedures:  No procedures performed Allergies: Bee venom, Hydrocodone, Bismuth subsalicylate, and Latex   Assessment / Plan:  Visit Diagnoses: No diagnosis found.  Orders: No orders of the defined types were placed in this encounter.  No orders of the defined types were placed in this encounter.   Face-to-face time spent with patient was *** minutes. Greater than 50% of time was spent in counseling and coordination of care.  Follow-Up  Instructions: No follow-ups on file.   Pollyann Savoy, MD  Note - This record has been created using Animal nutritionist.  Chart creation errors have been sought, but may not always  have been located. Such creation errors do not reflect on  the standard of medical care.

## 2023-09-13 ENCOUNTER — Ambulatory Visit
Admission: RE | Admit: 2023-09-13 | Discharge: 2023-09-13 | Disposition: A | Discharge status: Home / Self Care | Payer: Managed Care, Other (non HMO) | Source: Ambulatory Visit | Attending: Obstetrics and Gynecology | Admitting: Obstetrics and Gynecology

## 2023-09-13 DIAGNOSIS — Z1231 Encounter for screening mammogram for malignant neoplasm of breast: Secondary | ICD-10-CM

## 2023-09-15 ENCOUNTER — Ambulatory Visit: Payer: Managed Care, Other (non HMO) | Admitting: Rheumatology

## 2023-09-15 DIAGNOSIS — K9041 Non-celiac gluten sensitivity: Secondary | ICD-10-CM

## 2023-09-15 DIAGNOSIS — M79642 Pain in left hand: Secondary | ICD-10-CM

## 2023-09-15 DIAGNOSIS — M1612 Unilateral primary osteoarthritis, left hip: Secondary | ICD-10-CM

## 2023-09-15 DIAGNOSIS — M2550 Pain in unspecified joint: Secondary | ICD-10-CM

## 2023-09-15 DIAGNOSIS — M47816 Spondylosis without myelopathy or radiculopathy, lumbar region: Secondary | ICD-10-CM

## 2023-09-15 DIAGNOSIS — K581 Irritable bowel syndrome with constipation: Secondary | ICD-10-CM

## 2023-09-15 DIAGNOSIS — J302 Other seasonal allergic rhinitis: Secondary | ICD-10-CM

## 2023-09-15 DIAGNOSIS — F439 Reaction to severe stress, unspecified: Secondary | ICD-10-CM

## 2023-09-15 DIAGNOSIS — M170 Bilateral primary osteoarthritis of knee: Secondary | ICD-10-CM

## 2023-09-15 DIAGNOSIS — L509 Urticaria, unspecified: Secondary | ICD-10-CM

## 2023-09-15 DIAGNOSIS — S8262XS Displaced fracture of lateral malleolus of left fibula, sequela: Secondary | ICD-10-CM

## 2023-11-22 ENCOUNTER — Ambulatory Visit: Admitting: Podiatry

## 2023-11-30 ENCOUNTER — Other Ambulatory Visit: Payer: Self-pay

## 2023-11-30 ENCOUNTER — Ambulatory Visit (INDEPENDENT_AMBULATORY_CARE_PROVIDER_SITE_OTHER): Admitting: Podiatry

## 2023-11-30 ENCOUNTER — Ambulatory Visit

## 2023-11-30 DIAGNOSIS — M21611 Bunion of right foot: Secondary | ICD-10-CM

## 2023-11-30 DIAGNOSIS — M21612 Bunion of left foot: Secondary | ICD-10-CM

## 2023-11-30 DIAGNOSIS — M205X1 Other deformities of toe(s) (acquired), right foot: Secondary | ICD-10-CM

## 2023-11-30 DIAGNOSIS — M21619 Bunion of unspecified foot: Secondary | ICD-10-CM

## 2023-11-30 MED ORDER — TRIAMCINOLONE ACETONIDE 10 MG/ML IJ SUSP
2.5000 mg | Freq: Once | INTRAMUSCULAR | Status: AC
Start: 2023-11-30 — End: 2023-11-30
  Administered 2023-11-30: 2.5 mg via INTRA_ARTICULAR

## 2023-11-30 MED ORDER — DEXAMETHASONE SODIUM PHOSPHATE 120 MG/30ML IJ SOLN
4.0000 mg | Freq: Once | INTRAMUSCULAR | Status: AC
Start: 2023-11-30 — End: 2023-11-30
  Administered 2023-11-30: 4 mg via INTRA_ARTICULAR

## 2023-11-30 NOTE — Progress Notes (Signed)
  Subjective:  Patient ID: [name], female    DOB: 1970/12/05,   MRN: [id number]  No chief complaint on file.   53 y.o. female presents for concern of bilatearl great toe pain. Relates issues mostly with the left great toe area. Has been dealing with this for years. Has followed with other podiatrist and had injections and dealt with neuromas. Relates it has not bothered her in a while but with sandal season has been having trouble with some of her sandals and shoes . Denies any other pedal complaints. Denies n/v/f/c.   Past Medical History:  Diagnosis Date   Uterine fibroid     Objective:  Physical Exam: Vascular: DP/PT pulses 2/4 bilateral. CFT <3 seconds. Normal hair growth on digits. No edema.  Skin. No lacerations or abrasions bilateral feet.  Musculoskeletal: MMT 5/5 bilateral lower extremities in DF, PF, Inversion and Eversion. Deceased ROM in DF of ankle joint. 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 Some on the right but more so the left.  Neurological: Sensation intact to light touch.   Assessment:   1. Hallux limitus, right   2. Bunion, right      Plan:  Patient was evaluated and treated and all questions answered. -Xrays reviewed. Mild joint space narrowing of first MPJ bilateral with mild HAV deformity noted on left.  -Discussed hallux limitus /bunionand  treatement options; conservative and  Surgical management; risks, benefits, alternatives discussed. All patient's questions answered. -Patient opted for injection of first MTPJ. After verbal consent, injected into right/left 1st MTPJ for symptomatic relief 1cc marcaine plain, 1cc dexamethasone without complication.   -Recommend continue with good supportive shoes and inserts. Advised avoiding painful shoes.   -Patient to return in 6 weeks for recheck   Jennefer Moats, DPM

## 2023-12-29 ENCOUNTER — Ambulatory Visit: Admission: EM | Admit: 2023-12-29 | Discharge: 2023-12-29 | Disposition: A | Discharge status: Home / Self Care

## 2023-12-29 ENCOUNTER — Ambulatory Visit

## 2023-12-29 DIAGNOSIS — N912 Amenorrhea, unspecified: Secondary | ICD-10-CM | POA: Insufficient documentation

## 2023-12-29 DIAGNOSIS — M7989 Other specified soft tissue disorders: Secondary | ICD-10-CM | POA: Diagnosis not present

## 2023-12-29 DIAGNOSIS — M6740 Ganglion, unspecified site: Secondary | ICD-10-CM

## 2023-12-29 DIAGNOSIS — M79642 Pain in left hand: Secondary | ICD-10-CM

## 2023-12-29 MED ORDER — PREDNISONE 50 MG PO TABS: ORAL_TABLET | ORAL | 0 refills | Status: DC

## 2023-12-29 NOTE — ED Provider Notes (Signed)
 [name]    CSN: [id number] Arrival date & time: 12/29/23  1605      History   Chief Complaint Chief Complaint  Patient presents with   Hand Pain   Hand Swelling    HPI [name] is a 53 y.o. female.   HPI 53 year old female presents with left hand swelling and pain for days.  PMH significant for primary osteoarthritis of left hip, uterine leiomyoma, and IBS with constipation.  Past Medical History:  Diagnosis Date   Uterine fibroid     Patient Active Problem List   Diagnosis Date Noted   Amenorrhea 12/29/2023   Seasonal allergies 08/02/2023   Gluten intolerance 08/02/2023   Irritable bowel syndrome with constipation 08/02/2023   Degeneration of intervertebral disc of lumbar region without discogenic back pain or lower extremity pain 08/02/2023   Closed displaced fracture of lateral malleolus of left fibula 08/02/2023   Primary osteoarthritis of left hip 08/02/2023   Chronic idiopathic constipation 09/27/2022   Neuritis or radiculitis due to rupture of lumbar intervertebral disc 09/09/2022   Uterine leiomyoma 08/22/2022   Foot pain, left 08/21/2019   Tobacco non-user 08/21/2019   Heart murmur 05/20/2019   Retained orthopedic hardware 01/25/2015   Other specified postprocedural states 09/29/2014   Overweight 09/29/2014   Broken bones 09/08/2014   Left ankle pain 09/08/2014   Spider veins 03/05/2012    Past Surgical History:  Procedure Laterality Date   ANKLE FRACTURE SURGERY Left 2016   BACK SURGERY     FRACTURE SURGERY  2016   ankle   NEUROMA SURGERY Left    UTERINE FIBROID SURGERY      OB History     Gravida  0   Para  0   Term  0   Preterm  0   AB  0   Living  0      SAB  0   IAB  0   Ectopic  0   Multiple  0   Live Births  0            Home Medications    Prior to Admission medications   Medication Sig Start Date End Date Taking? Authorizing Provider  fluticasone (FLONASE) 50 MCG/ACT nasal spray  Place 1 spray into the nose. 11/09/23  Yes [provider]  linaCLOtide (LINZESS PO) Take by mouth.   Yes [provider]  omeprazole (PRILOSEC) 20 MG capsule Take 20 mg by mouth daily. 11/25/23  Yes [provider]  predniSONE  (DELTASONE ) 50 MG tablet Take 1 tab p.o. daily for 5 days. 12/29/23  Yes Leonides Ramp, FNP  Vitamin D, Ergocalciferol, (DRISDOL) 1.25 MG (50000 UNIT) CAPS capsule Take 50,000 Units by mouth once a week. 11/01/23  Yes [provider]  albuterol  (VENTOLIN  HFA) 108 (90 Base) MCG/ACT inhaler Inhale 1-2 puffs into the lungs every 6 (six) hours as needed for wheezing or shortness of breath. 07/28/23 08/27/23  Leonides Ramp, FNP  EPINEPHrine (EPIPEN 2-PAK) 0.3 mg/0.3 mL IJ SOAJ injection     [provider]  fexofenadine  (ALLEGRA ) 180 MG tablet Take 1 tablet by mouth daily.    [provider]  L-Lysine HCl 500 MG TABS Take by mouth.    [provider]  linaclotide Glory Larsen) 145 MCG CAPS capsule 1 PO before first meal of the day Patient not taking: Reported on 08/02/2023    [provider]  methocarbamol (ROBAXIN) 500 MG tablet TAKE 1 TABLET BY MOUTH 3 TIMES A DAY  AS NEEDED FOR MUSCLE SPASMS    [provider]  omeprazole (PRILOSEC) 40 MG capsule Take 1 capsule by mouth daily.    [provider]  tranexamic acid (LYSTEDA) 650 MG TABS tablet tranexamic acid 650 mg tablet  TAKE 2 TABLETS BY MOUTH 3 TIMES PER DAY FOR 5 DAYS DURING MENSTRUAL CYCLE 02/10/21   [provider]    Family History Family History  Problem Relation Age of Onset   Thyroid disease Mother    Macular degeneration Mother    Glaucoma Mother    Gout Father    GER disease Father    Deep vein thrombosis Father     Social History Social History   Tobacco Use   Smoking status: Never    Passive exposure: Past   Smokeless tobacco: Never  Vaping Use   Vaping status: Never Used  Substance Use Topics   Alcohol  use: Yes    Alcohol/week: 2.0 standard drinks of alcohol    Types: 2 Standard drinks or equivalent per week   Drug use: Never     Allergies   Bee venom, Hydrocodone, Bismuth subsalicylate, and Latex   Review of Systems Review of Systems   Physical Exam Triage Vital Signs ED Triage Vitals [12/29/23 1615]  Encounter Vitals Group     BP      Systolic BP Percentile      Diastolic BP Percentile      Pulse      Resp      Temp      Temp src      SpO2      Weight      Height      Head Circumference      Peak Flow      Pain Score 2     Pain Loc      Pain Education      Exclude from Growth Chart    No data found.  Updated Vital Signs BP 115/79 (BP Location: Right Arm)   Pulse 61   Temp 98.8 F (37.1 C) (Oral)   Resp 18   LMP 12/24/2023   SpO2 98%   Physical Exam Vitals and nursing note reviewed.  Constitutional:      Appearance: Normal appearance.  HENT:     Head: Normocephalic and atraumatic.     Mouth/Throat:     Mouth: Mucous membranes are moist.     Pharynx: Oropharynx is clear.  Eyes:     Extraocular Movements: Extraocular movements intact.     Conjunctiva/sclera: Conjunctivae normal.     Pupils: Pupils are equal, round, and reactive to light.  Cardiovascular:     Rate and Rhythm: Normal rate and regular rhythm.     Pulses: Normal pulses.     Heart sounds: Normal heart sounds.  Musculoskeletal:        General: Normal range of motion.     Cervical back: Normal range of motion and neck supple.  Skin:    General: Skin is warm and dry.     Comments: Left hand (dorsum): mild soft tissue swelling noted, possible early ganglion cyst, please see image below  Neurological:     General: No focal deficit present.     Mental Status: She is alert and oriented to person, place, and time.      UC Treatments / Results  Labs (all labs ordered are listed, but only abnormal results are displayed) Labs Reviewed - No data to display  EKG  Radiology DG Hand  Complete Left Result Date: 12/29/2023 CLINICAL DATA:  Hand pain with swelling EXAM: LEFT HAND - COMPLETE 3+ VIEW COMPARISON:  08/02/2023 FINDINGS: No fracture or malalignment. The joint spaces are generally patent. The soft tissues are unremarkable IMPRESSION: Negative. Electronically Signed   By: Esmeralda Hedge M.D.   On: 12/29/2023 16:48    Procedures Procedures (including critical care time)  Medications Ordered in UC Medications - No data to display  Initial Impression / Assessment and Plan / UC Course  I have reviewed the triage vital signs and the nursing notes.  Pertinent labs & imaging results that were available during my care of the patient were reviewed by me and considered in my medical decision making (see chart for details).     MDM: 1.  Swelling of left hand-left hand x-ray results revealed above, patient advised.  Rx'd prednisone  50 mg tablet: Take 1 tablet daily x 5 days; 2.  Ganglion cyst-on dorsum of left hand small ( 1.5 cm) flat in nature. Advised patient to take medication as directed with food to completion.  Encouraged increase daily water intake to 64 ounces per day while taking his medication.  Advised if symptoms worsen and/or unresolved please follow-up with [facility] for further evaluation.  Contact information provided with his AVS today. Final Clinical Impressions(s) / UC Diagnoses   Final diagnoses:  Swelling of left hand  Ganglion cyst     Discharge Instructions      Advised patient to take medication as directed with food to completion.  Encouraged increase daily water intake to 64 ounces per day while taking his medication.  Advised if symptoms worsen and/or unresolved please follow-up with [facility] orthopedics for further evaluation.  Contact information provided with his AVS today.   ED Prescriptions     Medication Sig Dispense Auth. Provider   predniSONE  (DELTASONE ) 50 MG tablet Take 1 tab p.o. daily for 5 days. 5 tablet Aisa Schoeppner,  Dashanti Burr, FNP      PDMP not reviewed this encounter.   Leonides Ramp, FNP 12/29/23 1714

## 2023-12-29 NOTE — ED Triage Notes (Signed)
 Pt states she has been having left hand swelling and pain. Pt denies any known injury.  Start Date: Unsure of start date, but really notice it today  Home Interventions: Ice

## 2023-12-29 NOTE — Discharge Instructions (Addendum)
 Advised patient to take medication as directed with food to completion.  Encouraged increase daily water intake to 64 ounces per day while taking his medication.  Advised if symptoms worsen and/or unresolved please follow-up with [facility] orthopedics for further evaluation.  Contact information provided with his AVS today.

## 2024-01-03 NOTE — Progress Notes (Deleted)
 Office Visit Note  Patient: [name]             Date of Birth: [birth year]           MRN: [id number]             PCP: Jonathon Neighbors, MD Referring: Jonathon Neighbors, MD Visit Date: 01/17/2024 Occupation: @GUAROCC @  Subjective:  No chief complaint on file.   History of Present Illness: [name] is a 53 y.o. female ***     Activities of Daily Living:  Patient reports morning stiffness for *** {minute/hour:19697}.   Patient {ACTIONS;DENIES/REPORTS:21021675::"Denies"} nocturnal pain.  Difficulty dressing/grooming: {ACTIONS;DENIES/REPORTS:21021675::"Denies"} Difficulty climbing stairs: {ACTIONS;DENIES/REPORTS:21021675::"Denies"} Difficulty getting out of chair: {ACTIONS;DENIES/REPORTS:21021675::"Denies"} Difficulty using hands for taps, buttons, cutlery, and/or writing: {ACTIONS;DENIES/REPORTS:21021675::"Denies"}  No Rheumatology ROS completed.   PMFS History:  Patient Active Problem List   Diagnosis Date Noted   Amenorrhea 12/29/2023   Seasonal allergies 08/02/2023   Gluten intolerance 08/02/2023   Irritable bowel syndrome with constipation 08/02/2023   Degeneration of intervertebral disc of lumbar region without discogenic back pain or lower extremity pain 08/02/2023   Closed displaced fracture of lateral malleolus of left fibula 08/02/2023   Primary osteoarthritis of left hip 08/02/2023   Chronic idiopathic constipation 09/27/2022   Neuritis or radiculitis due to rupture of lumbar intervertebral disc 09/09/2022   Uterine leiomyoma 08/22/2022   Foot pain, left 08/21/2019   Tobacco non-user 08/21/2019   Heart murmur 05/20/2019   Retained orthopedic hardware 01/25/2015   Other specified postprocedural states 09/29/2014   Overweight 09/29/2014   Broken bones 09/08/2014   Left ankle pain 09/08/2014   Spider veins 03/05/2012    Past Medical History:  Diagnosis Date   Uterine fibroid     Family History  Problem Relation Age of Onset   Thyroid disease Mother     Macular degeneration Mother    Glaucoma Mother    Gout Father    GER disease Father    Deep vein thrombosis Father    Past Surgical History:  Procedure Laterality Date   ANKLE FRACTURE SURGERY Left 2016   BACK SURGERY     FRACTURE SURGERY  2016   ankle   NEUROMA SURGERY Left    UTERINE FIBROID SURGERY     Social History   Social History Narrative   Not on file    There is no immunization history on file for this patient.   Objective: Vital Signs: LMP 12/24/2023    Physical Exam   Musculoskeletal Exam: ***  CDAI Exam: CDAI Score: -- Patient Global: --; Provider Global: -- Swollen: --; Tender: -- Joint Exam 01/17/2024   No joint exam has been documented for this visit   There is currently no information documented on the homunculus. Go to the Rheumatology activity and complete the homunculus joint exam.  Investigation: No additional findings.  Imaging: DG Hand Complete Left Result Date: 12/29/2023 CLINICAL DATA:  Hand pain with swelling EXAM: LEFT HAND - COMPLETE 3+ VIEW COMPARISON:  08/02/2023 FINDINGS: No fracture or malalignment. The joint spaces are generally patent. The soft tissues are unremarkable IMPRESSION: Negative. Electronically Signed   By: Esmeralda Hedge M.D.   On: 12/29/2023 16:48    Recent Labs: No results found for: "WBC", "HGB", "PLT", "NA", "K", "CL", "CO2", "GLUCOSE", "BUN", "CREATININE", "BILITOT", "ALKPHOS", "AST", "ALT", "PROT", "ALBUMIN", "CALCIUM", "GFRAA", "QFTBGOLD", "QFTBGOLDPLUS"   August 02, 2023 ANA 1: 40 NS, 1: 40 NH, RF negative, anti-CCP negative, uric acid 3.4, sed rate 2  Speciality  Comments: No specialty comments available.  Procedures:  No procedures performed Allergies: Bee venom, Hydrocodone, Bismuth subsalicylate, and Latex   Assessment / Plan:     Visit Diagnoses: No diagnosis found.  Orders: No orders of the defined types were placed in this encounter.  No orders of the defined types were placed in this  encounter.   Face-to-face time spent with patient was *** minutes. Greater than 50% of time was spent in counseling and coordination of care.  Follow-Up Instructions: No follow-ups on file.   Nicholas Bari, MD  Note - This record has been created using Animal nutritionist.  Chart creation errors have been sought, but may not always  have been located. Such creation errors do not reflect on  the standard of medical care.

## 2024-01-12 ENCOUNTER — Ambulatory Visit (INDEPENDENT_AMBULATORY_CARE_PROVIDER_SITE_OTHER): Admitting: Podiatry

## 2024-01-12 DIAGNOSIS — Z91199 Patient's noncompliance with other medical treatment and regimen due to unspecified reason: Secondary | ICD-10-CM

## 2024-01-12 NOTE — Progress Notes (Signed)
 No show

## 2024-01-17 ENCOUNTER — Ambulatory Visit: Payer: Managed Care, Other (non HMO) | Admitting: Rheumatology

## 2024-01-17 DIAGNOSIS — M170 Bilateral primary osteoarthritis of knee: Secondary | ICD-10-CM

## 2024-01-17 DIAGNOSIS — F439 Reaction to severe stress, unspecified: Secondary | ICD-10-CM

## 2024-01-17 DIAGNOSIS — K581 Irritable bowel syndrome with constipation: Secondary | ICD-10-CM

## 2024-01-17 DIAGNOSIS — M2550 Pain in unspecified joint: Secondary | ICD-10-CM

## 2024-01-17 DIAGNOSIS — S8262XS Displaced fracture of lateral malleolus of left fibula, sequela: Secondary | ICD-10-CM

## 2024-01-17 DIAGNOSIS — L509 Urticaria, unspecified: Secondary | ICD-10-CM

## 2024-01-17 DIAGNOSIS — M51369 Other intervertebral disc degeneration, lumbar region without mention of lumbar back pain or lower extremity pain: Secondary | ICD-10-CM

## 2024-01-17 DIAGNOSIS — K9041 Non-celiac gluten sensitivity: Secondary | ICD-10-CM

## 2024-01-17 DIAGNOSIS — M19041 Primary osteoarthritis, right hand: Secondary | ICD-10-CM

## 2024-01-17 DIAGNOSIS — M1612 Unilateral primary osteoarthritis, left hip: Secondary | ICD-10-CM

## 2024-01-17 DIAGNOSIS — J302 Other seasonal allergic rhinitis: Secondary | ICD-10-CM

## 2024-02-01 ENCOUNTER — Ambulatory Visit: Admitting: Podiatry

## 2024-04-20 ENCOUNTER — Encounter: Payer: Self-pay | Admitting: Emergency Medicine

## 2024-04-20 ENCOUNTER — Ambulatory Visit
Admission: EM | Admit: 2024-04-20 | Discharge: 2024-04-20 | Disposition: A | Discharge status: Home / Self Care | Attending: Family Medicine | Admitting: Family Medicine

## 2024-04-20 DIAGNOSIS — R0981 Nasal congestion: Secondary | ICD-10-CM

## 2024-04-20 DIAGNOSIS — J0100 Acute maxillary sinusitis, unspecified: Secondary | ICD-10-CM | POA: Diagnosis not present

## 2024-04-20 MED ORDER — PREDNISONE 20 MG PO TABS: ORAL_TABLET | ORAL | 0 refills | Status: AC

## 2024-04-20 MED ORDER — AMOXICILLIN-POT CLAVULANATE 875-125 MG PO TABS: 1.0000 | ORAL_TABLET | Freq: Two times a day (BID) | ORAL | 0 refills | Status: AC

## 2024-04-20 NOTE — ED Triage Notes (Signed)
 Patient c/o extreme fatigue, cough worse at night, afebrile, nasal drainage x 4 days.  Patient has been taken Mucinex, nasal rinses, Tylenol Maximum Strength.

## 2024-04-20 NOTE — Discharge Instructions (Addendum)
 Advised patient to take medications as directed with food to completion.  Advised patient to take prednisone with first dose of Augmentin for the next 5 of 7 days.  Encouraged to increase daily water intake to 64 ounces per day while taking these medications.  Advised if symptoms worsen and/or unresolved please follow-up with your PCP or here for further evaluation.

## 2024-04-20 NOTE — ED Provider Notes (Signed)
 [name]    CSN: [id number] Arrival date & time: 04/20/24  1343      History   Chief Complaint Chief Complaint  Patient presents with   Cough    HPI [name] is a 53 y.o. female.   HPI 53 year old female presents with extreme fatigue, cough worse at night afebrile and nasal drainage for 4 days.  Patient reports taking over-the-counter Mucinex and nasal rinses with Tylenol maximal strength.  PMH significant for uterine fibroid  Past Medical History:  Diagnosis Date   Uterine fibroid     Patient Active Problem List   Diagnosis Date Noted   Amenorrhea 12/29/2023   Seasonal allergies 08/02/2023   Gluten intolerance 08/02/2023   Irritable bowel syndrome with constipation 08/02/2023   Degeneration of intervertebral disc of lumbar region without discogenic back pain or lower extremity pain 08/02/2023   Closed displaced fracture of lateral malleolus of left fibula 08/02/2023   Primary osteoarthritis of left hip 08/02/2023   Chronic idiopathic constipation 09/27/2022   Neuritis or radiculitis due to rupture of lumbar intervertebral disc 09/09/2022   Uterine leiomyoma 08/22/2022   Foot pain, left 08/21/2019   Tobacco non-user 08/21/2019   Heart murmur 05/20/2019   Retained orthopedic hardware 01/25/2015   Other specified postprocedural states 09/29/2014   Overweight 09/29/2014   Broken bones 09/08/2014   Left ankle pain 09/08/2014   Spider veins 03/05/2012    Past Surgical History:  Procedure Laterality Date   ANKLE FRACTURE SURGERY Left 2016   BACK SURGERY     FRACTURE SURGERY  2016   ankle   NEUROMA SURGERY Left    UTERINE FIBROID SURGERY      OB History     Gravida  0   Para  0   Term  0   Preterm  0   AB  0   Living  0      SAB  0   IAB  0   Ectopic  0   Multiple  0   Live Births  0            Home Medications    Prior to Admission medications   Medication Sig Start Date End Date Taking? Authorizing  Provider  amoxicillin -clavulanate (AUGMENTIN ) 875-125 MG tablet Take 1 tablet by mouth every 12 (twelve) hours. 04/20/24  Yes Teddy Sharper, FNP  EPINEPHrine (EPIPEN 2-PAK) 0.3 mg/0.3 mL IJ SOAJ injection    Yes [provider]  fexofenadine  (ALLEGRA ) 180 MG tablet Take 1 tablet by mouth daily.   Yes [provider]  fluticasone (FLONASE) 50 MCG/ACT nasal spray Place 1 spray into the nose. 11/09/23  Yes [provider]  linaclotide LARUE) 145 MCG CAPS capsule    Yes [provider]  predniSONE  (DELTASONE ) 20 MG tablet Take 3 tabs PO daily x 5 days. 04/20/24  Yes Teddy Sharper, FNP  tranexamic acid (LYSTEDA) 650 MG TABS tablet tranexamic acid 650 mg tablet  TAKE 2 TABLETS BY MOUTH 3 TIMES PER DAY FOR 5 DAYS DURING MENSTRUAL CYCLE 02/10/21  Yes [provider]  albuterol  (VENTOLIN  HFA) 108 (90 Base) MCG/ACT inhaler Inhale 1-2 puffs into the lungs every 6 (six) hours as needed for wheezing or shortness of breath. 07/28/23 08/27/23  Teddy Sharper, FNP  L-Lysine HCl 500 MG TABS Take by mouth.    [provider]  linaCLOtide (LINZESS PO) Take by mouth.    [provider]  methocarbamol (ROBAXIN) 500 MG tablet TAKE 1 TABLET BY  MOUTH 3 TIMES A DAY AS NEEDED FOR MUSCLE SPASMS    [provider]  omeprazole (PRILOSEC) 20 MG capsule Take 20 mg by mouth daily. 11/25/23   [provider]  omeprazole (PRILOSEC) 40 MG capsule Take 1 capsule by mouth daily.    [provider]  Vitamin D, Ergocalciferol, (DRISDOL) 1.25 MG (50000 UNIT) CAPS capsule Take 50,000 Units by mouth once a week. 11/01/23   [provider]    Family History Family History  Problem Relation Age of Onset   Thyroid disease Mother    Macular degeneration Mother    Glaucoma Mother    Gout Father    GER disease Father    Deep vein thrombosis Father     Social History Social History   Tobacco Use   Smoking status: Never    Passive  exposure: Past   Smokeless tobacco: Never  Vaping Use   Vaping status: Never Used  Substance Use Topics   Alcohol use: Yes    Alcohol/week: 2.0 standard drinks of alcohol    Types: 2 Standard drinks or equivalent per week   Drug use: Never     Allergies   Bee venom, Hydrocodone, Bismuth subsalicylate, and Latex   Review of Systems Review of Systems  Constitutional:  Positive for fatigue.  Respiratory:  Positive for cough.   All other systems reviewed and are negative.    Physical Exam Triage Vital Signs ED Triage Vitals  Encounter Vitals Group     BP 04/20/24 1402 107/61     Girls Systolic BP Percentile --      Girls Diastolic BP Percentile --      Boys Systolic BP Percentile --      Boys Diastolic BP Percentile --      Pulse Rate 04/20/24 1402 67     Resp 04/20/24 1402 20     Temp 04/20/24 1402 98.9 F (37.2 C)     Temp Source 04/20/24 1402 Oral     SpO2 04/20/24 1402 94 %     Weight 04/20/24 1400 140 lb (63.5 kg)     Height 04/20/24 1400 5' 2 (1.575 m)     Head Circumference --      Peak Flow --      Pain Score 04/20/24 1400 0     Pain Loc --      Pain Education --      Exclude from Growth Chart --    No data found.  Updated Vital Signs BP 107/61 (BP Location: Right Arm)   Pulse 67   Temp 98.9 F (37.2 C) (Oral)   Resp 20   Ht 5' 2 (1.575 m)   Wt 140 lb (63.5 kg)   SpO2 94%   BMI 25.61 kg/m    Physical Exam Vitals and nursing note reviewed.  Constitutional:      General: She is not in acute distress.    Appearance: Normal appearance. She is normal weight. She is not ill-appearing.  HENT:     Head: Normocephalic and atraumatic.     Right Ear: Tympanic membrane and external ear normal.     Left Ear: Tympanic membrane and external ear normal.     Ears:     Comments: Significant compression of bilateral ear canals    Nose:     Right Turbinates: Enlarged.     Left Turbinates: Enlarged.     Right Sinus: Maxillary sinus tenderness present.      Left Sinus: Maxillary sinus  tenderness present.     Mouth/Throat:     Mouth: Mucous membranes are moist.     Pharynx: Oropharynx is clear.  Eyes:     Extraocular Movements: Extraocular movements intact.     Pupils: Pupils are equal, round, and reactive to light.  Cardiovascular:     Rate and Rhythm: Normal rate and regular rhythm.     Pulses: Normal pulses.     Heart sounds: Normal heart sounds.  Pulmonary:     Effort: Pulmonary effort is normal.     Breath sounds: Normal breath sounds. No wheezing, rhonchi or rales.  Musculoskeletal:        General: Normal range of motion.     Cervical back: Normal range of motion and neck supple.  Skin:    General: Skin is warm and dry.  Neurological:     General: No focal deficit present.     Mental Status: She is alert and oriented to person, place, and time. Mental status is at baseline.  Psychiatric:        Mood and Affect: Mood normal.        Behavior: Behavior normal.      UC Treatments / Results  Labs (all labs ordered are listed, but only abnormal results are displayed) Labs Reviewed - No data to display  EKG   Radiology No results found.  Procedures Procedures (including critical care time)  Medications Ordered in UC Medications - No data to display  Initial Impression / Assessment and Plan / UC Course  I have reviewed the triage vital signs and the nursing notes.  Pertinent labs & imaging results that were available during my care of the patient were reviewed by me and considered in my medical decision making (see chart for details).     Acute maxillary sinusitis, recurrence not specified-Rx'd Augmentin  875/125 mg tablet: Take 1 tablet twice daily x 7 days; 2.  Nasal sinus congestion-Rx'd prednisone  20 mg tablet take 3 tablets p.o. daily x 5. Advised patient to take medications as directed with food to completion.  Advised patient to take prednisone  with first dose of Augmentin  for the next 5 of 7 days.  Encouraged to  increase daily water intake to 64 ounces per day while taking these medications.  Advised if symptoms worsen and/or unresolved please follow-up with your PCP or here for further evaluation. Final Clinical Impressions(s) / UC Diagnoses   Final diagnoses:  Nasal sinus congestion  Acute maxillary sinusitis, recurrence not specified     Discharge Instructions      Advised patient to take medications as directed with food to completion.  Advised patient to take prednisone  with first dose of Augmentin  for the next 5 of 7 days.  Encouraged to increase daily water intake to 64 ounces per day while taking these medications.  Advised if symptoms worsen and/or unresolved please follow-up with your PCP or here for further evaluation.     ED Prescriptions     Medication Sig Dispense Auth. Provider   amoxicillin -clavulanate (AUGMENTIN ) 875-125 MG tablet Take 1 tablet by mouth every 12 (twelve) hours. 14 tablet Deagan Sevin, FNP   predniSONE  (DELTASONE ) 20 MG tablet Take 3 tabs PO daily x 5 days. 15 tablet Macio Kissoon, FNP      PDMP not reviewed this encounter.   Teddy Sharper, FNP 04/20/24 1442

## 2024-06-20 ENCOUNTER — Encounter: Payer: Self-pay | Admitting: Podiatry

## 2024-06-20 ENCOUNTER — Ambulatory Visit: Admitting: Podiatry

## 2024-06-20 DIAGNOSIS — M205X2 Other deformities of toe(s) (acquired), left foot: Secondary | ICD-10-CM

## 2024-06-20 MED ORDER — TRIAMCINOLONE ACETONIDE 10 MG/ML IJ SUSP
2.5000 mg | Freq: Once | INTRAMUSCULAR | Status: AC
  Administered 2024-06-20: 2.5 mg via INTRA_ARTICULAR

## 2024-06-20 MED ORDER — DEXAMETHASONE SODIUM PHOSPHATE 120 MG/30ML IJ SOLN
4.0000 mg | Freq: Once | INTRAMUSCULAR | Status: AC
  Administered 2024-06-20: 4 mg via INTRA_ARTICULAR

## 2024-06-20 NOTE — Progress Notes (Signed)
  Subjective:  Patient ID: [name], female    DOB: 1970/10/21,   MRN: [id number]  Chief Complaint  Patient presents with   Bunions    It's doing better.  I wore some western boots the other week and it got aggravated.    53 y.o. female presents for follow-up of left hallux limitus and bunion pain.  She relates she is doing well.  Last week she wore some Western boots and that aggravated the area.  This prompted her to make an appointment to come in for possible another injection.  She relates she also has concern about left second toe.  Denies any other pedal complaints. Denies n/v/f/c.   Past Medical History:  Diagnosis Date   Uterine fibroid     Objective:  Physical Exam: Vascular: DP/PT pulses 2/4 bilateral. CFT <3 seconds. Normal hair growth on digits. No edema.  Skin. No lacerations or abrasions bilateral feet.  Left second digit mildly dystrophic and thickened. Musculoskeletal: MMT 5/5 bilateral lower extremities in DF, PF, Inversion and Eversion. Deceased ROM in DF of ankle joint. 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 Some on the right but more so the left.  Neurological: Sensation intact to light touch.   Assessment:   1. Hallux limitus, left       Plan:  Patient was evaluated and treated and all questions answered. -Xrays reviewed. Mild joint space narrowing of first MPJ bilateral with mild HAV deformity noted on left.  -Discussed hallux limitus /bunionand  treatement options; conservative and  Surgical management; risks, benefits, alternatives discussed. All patient's questions answered. -Patient opted for injection of first MTPJ. After verbal consent, injected into left 1st MTPJ for symptomatic relief 1cc marcaine plain, 1cc dexamethasone  without complication.   -Recommend continue with good supportive shoes and inserts. Advised avoiding painful shoes -Discussed nail changes likely the  cause of trauma to the nail.  She does have mild hammering of the second digit could be contributing.  Advised on proper shoe gear and proper care of the nail. -Patient to return I as needed   Asberry Failing, DPM

## 2024-08-21 ENCOUNTER — Other Ambulatory Visit: Payer: Self-pay | Admitting: Obstetrics and Gynecology

## 2024-08-21 DIAGNOSIS — Z1231 Encounter for screening mammogram for malignant neoplasm of breast: Secondary | ICD-10-CM

## 2024-09-13 ENCOUNTER — Ambulatory Visit
Admission: RE | Admit: 2024-09-13 | Discharge: 2024-09-13 | Disposition: A | Source: Ambulatory Visit | Attending: Obstetrics and Gynecology | Admitting: Obstetrics and Gynecology

## 2024-09-13 DIAGNOSIS — Z1231 Encounter for screening mammogram for malignant neoplasm of breast: Secondary | ICD-10-CM
